# Patient Record
Sex: Male | Born: 1935 | Race: White | Hispanic: No | Marital: Married
Health system: Southern US, Community
[De-identification: ages and names within clinical notes are randomized; demographics above are authoritative.]

---

## 1998-11-02 ENCOUNTER — Ambulatory Visit (HOSPITAL_COMMUNITY): Admission: RE | Admit: 1998-11-02 | Discharge: 1998-11-02 | Payer: Self-pay | Admitting: Cardiology

## 1998-11-02 ENCOUNTER — Encounter: Payer: Self-pay | Admitting: Cardiology

## 1999-02-11 ENCOUNTER — Encounter: Payer: Self-pay | Admitting: Emergency Medicine

## 1999-02-11 ENCOUNTER — Inpatient Hospital Stay (HOSPITAL_COMMUNITY): Admission: EM | Admit: 1999-02-11 | Discharge: 1999-02-12 | Payer: Self-pay | Admitting: Emergency Medicine

## 1999-02-12 ENCOUNTER — Encounter: Payer: Self-pay | Admitting: Cardiology

## 1999-09-20 ENCOUNTER — Encounter (HOSPITAL_COMMUNITY): Admission: RE | Admit: 1999-09-20 | Discharge: 1999-12-19 | Payer: Self-pay | Admitting: Internal Medicine

## 1999-10-22 ENCOUNTER — Encounter: Admission: RE | Admit: 1999-10-22 | Discharge: 1999-10-22 | Payer: Self-pay | Admitting: Cardiology

## 1999-10-22 ENCOUNTER — Encounter: Payer: Self-pay | Admitting: Cardiology

## 1999-10-26 ENCOUNTER — Inpatient Hospital Stay (HOSPITAL_COMMUNITY): Admission: EM | Admit: 1999-10-26 | Discharge: 1999-11-04 | Payer: Self-pay | Admitting: Emergency Medicine

## 1999-10-28 ENCOUNTER — Encounter: Payer: Self-pay | Admitting: Thoracic Surgery (Cardiothoracic Vascular Surgery)

## 1999-10-29 ENCOUNTER — Encounter: Payer: Self-pay | Admitting: Thoracic Surgery (Cardiothoracic Vascular Surgery)

## 1999-10-30 ENCOUNTER — Encounter: Payer: Self-pay | Admitting: Thoracic Surgery (Cardiothoracic Vascular Surgery)

## 1999-10-31 ENCOUNTER — Encounter: Payer: Self-pay | Admitting: Thoracic Surgery (Cardiothoracic Vascular Surgery)

## 1999-11-01 ENCOUNTER — Encounter: Payer: Self-pay | Admitting: Thoracic Surgery (Cardiothoracic Vascular Surgery)

## 1999-11-18 ENCOUNTER — Encounter
Admission: RE | Admit: 1999-11-18 | Discharge: 1999-11-18 | Payer: Self-pay | Admitting: Thoracic Surgery (Cardiothoracic Vascular Surgery)

## 1999-11-18 ENCOUNTER — Encounter: Payer: Self-pay | Admitting: Thoracic Surgery (Cardiothoracic Vascular Surgery)

## 1999-12-03 ENCOUNTER — Encounter (HOSPITAL_COMMUNITY): Admission: RE | Admit: 1999-12-03 | Discharge: 2000-03-02 | Payer: Self-pay | Admitting: Cardiology

## 2000-01-13 ENCOUNTER — Encounter: Payer: Self-pay | Admitting: *Deleted

## 2000-01-13 ENCOUNTER — Inpatient Hospital Stay (HOSPITAL_COMMUNITY): Admission: EM | Admit: 2000-01-13 | Discharge: 2000-01-14 | Payer: Self-pay | Admitting: *Deleted

## 2000-02-19 ENCOUNTER — Encounter (HOSPITAL_COMMUNITY): Admission: RE | Admit: 2000-02-19 | Discharge: 2000-05-19 | Payer: Self-pay | Admitting: Internal Medicine

## 2000-03-03 ENCOUNTER — Encounter (HOSPITAL_COMMUNITY): Admission: RE | Admit: 2000-03-03 | Discharge: 2000-06-01 | Payer: Self-pay | Admitting: Cardiology

## 2000-04-02 ENCOUNTER — Ambulatory Visit (HOSPITAL_COMMUNITY): Admission: RE | Admit: 2000-04-02 | Discharge: 2000-04-02 | Payer: Self-pay | Admitting: Internal Medicine

## 2000-11-12 ENCOUNTER — Encounter: Payer: Self-pay | Admitting: Emergency Medicine

## 2000-11-12 ENCOUNTER — Emergency Department (HOSPITAL_COMMUNITY): Admission: EM | Admit: 2000-11-12 | Discharge: 2000-11-12 | Payer: Self-pay | Admitting: Emergency Medicine

## 2000-11-16 ENCOUNTER — Emergency Department (HOSPITAL_COMMUNITY): Admission: EM | Admit: 2000-11-16 | Discharge: 2000-11-16 | Payer: Self-pay

## 2000-11-20 ENCOUNTER — Encounter: Payer: Self-pay | Admitting: Orthopedic Surgery

## 2000-11-20 ENCOUNTER — Encounter: Admission: RE | Admit: 2000-11-20 | Discharge: 2000-11-20 | Payer: Self-pay | Admitting: Cardiology

## 2000-11-20 ENCOUNTER — Encounter: Admission: RE | Admit: 2000-11-20 | Discharge: 2000-11-20 | Payer: Self-pay | Admitting: Orthopedic Surgery

## 2000-11-20 ENCOUNTER — Encounter: Payer: Self-pay | Admitting: Cardiology

## 2000-11-28 ENCOUNTER — Encounter: Payer: Self-pay | Admitting: Orthopedic Surgery

## 2000-11-28 ENCOUNTER — Encounter: Admission: RE | Admit: 2000-11-28 | Discharge: 2000-11-28 | Payer: Self-pay | Admitting: Orthopedic Surgery

## 2000-12-15 ENCOUNTER — Encounter: Payer: Self-pay | Admitting: Occupational Medicine

## 2000-12-15 ENCOUNTER — Encounter: Admission: RE | Admit: 2000-12-15 | Discharge: 2000-12-15 | Payer: Self-pay | Admitting: Occupational Medicine

## 2001-03-29 ENCOUNTER — Encounter: Payer: Self-pay | Admitting: Cardiology

## 2001-03-29 ENCOUNTER — Emergency Department (HOSPITAL_COMMUNITY): Admission: EM | Admit: 2001-03-29 | Discharge: 2001-03-29 | Payer: Self-pay | Admitting: Emergency Medicine

## 2001-03-30 ENCOUNTER — Encounter (INDEPENDENT_AMBULATORY_CARE_PROVIDER_SITE_OTHER): Payer: Self-pay

## 2001-03-30 ENCOUNTER — Ambulatory Visit (HOSPITAL_COMMUNITY): Admission: RE | Admit: 2001-03-30 | Discharge: 2001-03-30 | Payer: Self-pay | Admitting: *Deleted

## 2001-04-20 ENCOUNTER — Encounter: Admission: RE | Admit: 2001-04-20 | Discharge: 2001-04-20 | Payer: Self-pay | Admitting: Internal Medicine

## 2001-04-20 ENCOUNTER — Encounter: Payer: Self-pay | Admitting: Internal Medicine

## 2001-04-21 ENCOUNTER — Inpatient Hospital Stay (HOSPITAL_COMMUNITY): Admission: AD | Admit: 2001-04-21 | Discharge: 2001-04-23 | Payer: Self-pay | Admitting: Internal Medicine

## 2001-04-21 ENCOUNTER — Encounter (INDEPENDENT_AMBULATORY_CARE_PROVIDER_SITE_OTHER): Payer: Self-pay | Admitting: Specialist

## 2001-07-13 ENCOUNTER — Encounter: Payer: Self-pay | Admitting: Emergency Medicine

## 2001-07-14 ENCOUNTER — Encounter: Payer: Self-pay | Admitting: Emergency Medicine

## 2001-07-14 ENCOUNTER — Encounter: Payer: Self-pay | Admitting: Cardiology

## 2001-07-14 ENCOUNTER — Inpatient Hospital Stay (HOSPITAL_COMMUNITY): Admission: EM | Admit: 2001-07-14 | Discharge: 2001-07-18 | Payer: Self-pay

## 2001-07-15 ENCOUNTER — Encounter: Payer: Self-pay | Admitting: Cardiology

## 2001-08-25 ENCOUNTER — Ambulatory Visit (HOSPITAL_COMMUNITY): Admission: RE | Admit: 2001-08-25 | Discharge: 2001-08-25 | Payer: Self-pay | Admitting: Specialist

## 2001-10-10 ENCOUNTER — Encounter: Payer: Self-pay | Admitting: Emergency Medicine

## 2001-10-10 ENCOUNTER — Inpatient Hospital Stay (HOSPITAL_COMMUNITY): Admission: EM | Admit: 2001-10-10 | Discharge: 2001-10-12 | Payer: Self-pay | Admitting: Emergency Medicine

## 2001-10-11 ENCOUNTER — Encounter: Payer: Self-pay | Admitting: Cardiology

## 2002-07-22 ENCOUNTER — Encounter (HOSPITAL_COMMUNITY): Admission: RE | Admit: 2002-07-22 | Discharge: 2002-07-22 | Payer: Self-pay | Admitting: Internal Medicine

## 2002-11-17 ENCOUNTER — Encounter: Admission: RE | Admit: 2002-11-17 | Discharge: 2002-11-17 | Payer: Self-pay | Admitting: Internal Medicine

## 2002-11-17 ENCOUNTER — Encounter: Payer: Self-pay | Admitting: Internal Medicine

## 2003-09-23 ENCOUNTER — Emergency Department (HOSPITAL_COMMUNITY): Admission: EM | Admit: 2003-09-23 | Discharge: 2003-09-24 | Payer: Self-pay | Admitting: Emergency Medicine

## 2003-12-08 ENCOUNTER — Encounter: Admission: RE | Admit: 2003-12-08 | Discharge: 2003-12-08 | Payer: Self-pay | Admitting: Rheumatology

## 2003-12-16 ENCOUNTER — Encounter: Admission: RE | Admit: 2003-12-16 | Discharge: 2003-12-16 | Payer: Self-pay | Admitting: Rheumatology

## 2003-12-28 ENCOUNTER — Ambulatory Visit (HOSPITAL_COMMUNITY): Admission: RE | Admit: 2003-12-28 | Discharge: 2003-12-28 | Payer: Self-pay | Admitting: Orthopedic Surgery

## 2004-01-10 ENCOUNTER — Inpatient Hospital Stay (HOSPITAL_COMMUNITY): Admission: AD | Admit: 2004-01-10 | Discharge: 2004-01-12 | Payer: Self-pay | Admitting: Cardiovascular Disease

## 2004-02-12 ENCOUNTER — Emergency Department (HOSPITAL_COMMUNITY): Admission: EM | Admit: 2004-02-12 | Discharge: 2004-02-12 | Payer: Self-pay

## 2004-05-29 ENCOUNTER — Ambulatory Visit (HOSPITAL_COMMUNITY): Admission: RE | Admit: 2004-05-29 | Discharge: 2004-05-29 | Payer: Self-pay | Admitting: Gastroenterology

## 2004-06-19 ENCOUNTER — Encounter (HOSPITAL_COMMUNITY): Admission: RE | Admit: 2004-06-19 | Discharge: 2004-08-30 | Payer: Self-pay | Admitting: Internal Medicine

## 2004-06-22 ENCOUNTER — Emergency Department (HOSPITAL_COMMUNITY): Admission: EM | Admit: 2004-06-22 | Discharge: 2004-06-22 | Payer: Self-pay | Admitting: *Deleted

## 2004-06-24 ENCOUNTER — Inpatient Hospital Stay (HOSPITAL_COMMUNITY): Admission: AD | Admit: 2004-06-24 | Discharge: 2004-06-30 | Payer: Self-pay | Admitting: Internal Medicine

## 2004-07-05 ENCOUNTER — Ambulatory Visit (HOSPITAL_COMMUNITY): Admission: RE | Admit: 2004-07-05 | Discharge: 2004-07-05 | Payer: Self-pay | Admitting: Internal Medicine

## 2005-08-01 IMAGING — CR DG CHEST 1V PORT
1 series · 1 of 1 positions shown · non-contrast
Comparison: none

CLINICAL DATA: Chest pain.
 PORTABLE CHEST, 01/10/04, [DATE] HOURS
 There are changes of COPD.  There heart is normal in size.  There has been a previous median sternotomy.  There are no infiltrative or edematous changes.
 IMPRESSION
 Changes of COPD.  No active disease.

[view not recorded]
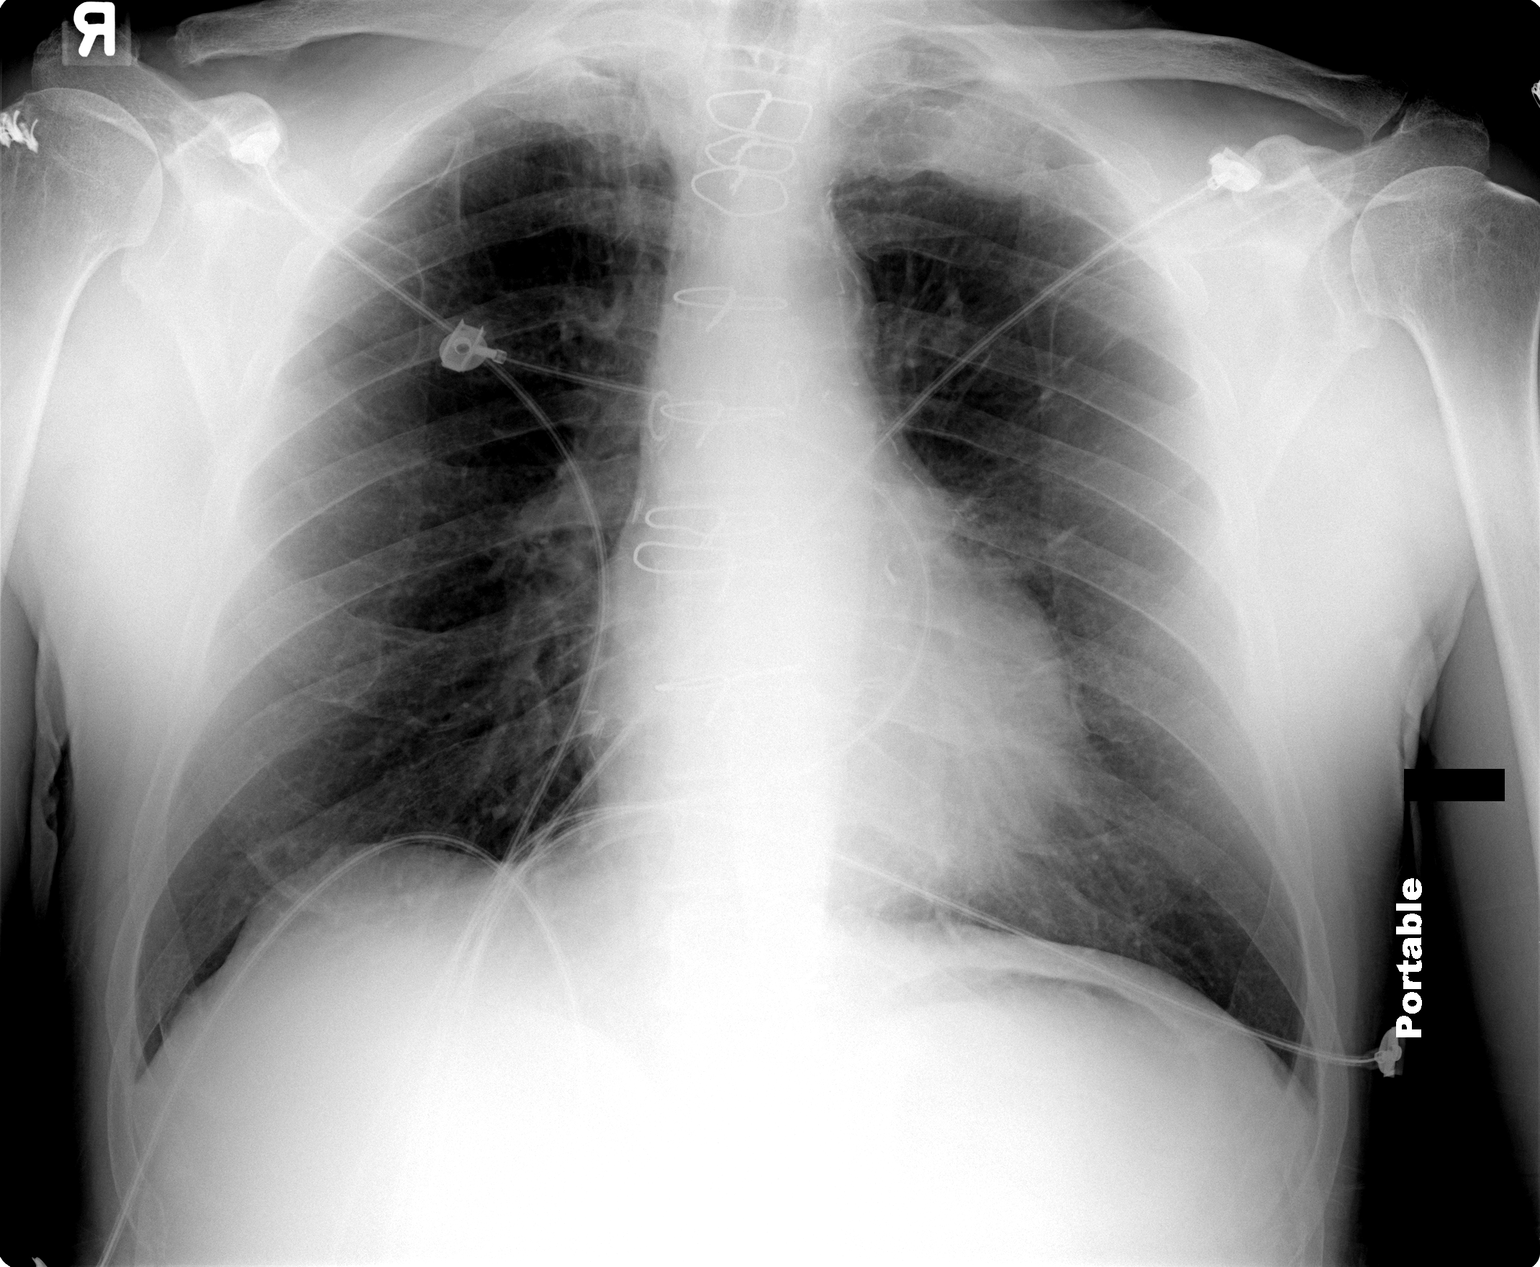

[1 of 1 positions shown; findings below may reference images not displayed]

## 2006-11-09 ENCOUNTER — Inpatient Hospital Stay (HOSPITAL_COMMUNITY): Admission: EM | Admit: 2006-11-09 | Discharge: 2006-11-12 | Payer: Self-pay | Admitting: Emergency Medicine

## 2006-11-29 ENCOUNTER — Inpatient Hospital Stay (HOSPITAL_COMMUNITY): Admission: EM | Admit: 2006-11-29 | Discharge: 2006-11-30 | Payer: Self-pay | Admitting: Emergency Medicine

## 2006-12-10 ENCOUNTER — Encounter (HOSPITAL_COMMUNITY): Admission: RE | Admit: 2006-12-10 | Discharge: 2007-03-10 | Payer: Self-pay | Admitting: Cardiology

## 2006-12-23 ENCOUNTER — Inpatient Hospital Stay (HOSPITAL_COMMUNITY): Admission: EM | Admit: 2006-12-23 | Discharge: 2006-12-26 | Payer: Self-pay | Admitting: Emergency Medicine

## 2007-01-05 ENCOUNTER — Inpatient Hospital Stay (HOSPITAL_COMMUNITY): Admission: AD | Admit: 2007-01-05 | Discharge: 2007-01-10 | Payer: Self-pay | Admitting: Cardiology

## 2007-01-05 ENCOUNTER — Ambulatory Visit: Payer: Self-pay | Admitting: Internal Medicine

## 2007-01-07 ENCOUNTER — Encounter (INDEPENDENT_AMBULATORY_CARE_PROVIDER_SITE_OTHER): Payer: Self-pay | Admitting: Cardiology

## 2007-01-21 ENCOUNTER — Ambulatory Visit: Payer: Self-pay

## 2007-02-04 ENCOUNTER — Ambulatory Visit: Payer: Self-pay | Admitting: Cardiology

## 2007-02-10 ENCOUNTER — Ambulatory Visit: Payer: Self-pay | Admitting: Cardiology

## 2007-02-10 LAB — CONVERTED CEMR LAB
Basophils Absolute: 0 10*3/uL (ref 0.0–0.1)
CO2: 32 meq/L (ref 19–32)
Calcium: 9.1 mg/dL (ref 8.4–10.5)
Creatinine, Ser: 1.2 mg/dL (ref 0.4–1.5)
Eosinophils Absolute: 0.3 10*3/uL (ref 0.0–0.6)
GFR calc Af Amer: 77 mL/min
Hemoglobin: 12.4 g/dL — ABNORMAL LOW (ref 13.0–17.0)
INR: 0.9 (ref 0.9–2.0)
Monocytes Relative: 10.9 % (ref 3.0–11.0)
Potassium: 4.1 meq/L (ref 3.5–5.1)
Prothrombin Time: 11.7 s (ref 10.0–14.0)
WBC: 4.8 10*3/uL (ref 4.5–10.5)

## 2007-02-17 ENCOUNTER — Ambulatory Visit: Payer: Self-pay | Admitting: Internal Medicine

## 2007-02-17 ENCOUNTER — Inpatient Hospital Stay (HOSPITAL_COMMUNITY): Admission: RE | Admit: 2007-02-17 | Discharge: 2007-02-17 | Payer: Self-pay | Admitting: Internal Medicine

## 2007-04-29 ENCOUNTER — Ambulatory Visit: Payer: Self-pay | Admitting: Internal Medicine

## 2007-07-08 ENCOUNTER — Ambulatory Visit: Payer: Self-pay | Admitting: Internal Medicine

## 2007-07-08 ENCOUNTER — Ambulatory Visit: Payer: Self-pay

## 2007-08-04 ENCOUNTER — Ambulatory Visit: Payer: Self-pay | Admitting: Internal Medicine

## 2007-08-10 ENCOUNTER — Encounter (INDEPENDENT_AMBULATORY_CARE_PROVIDER_SITE_OTHER): Payer: Self-pay | Admitting: Internal Medicine

## 2007-08-23 ENCOUNTER — Ambulatory Visit (HOSPITAL_COMMUNITY): Admission: RE | Admit: 2007-08-23 | Discharge: 2007-08-23 | Payer: Self-pay | Admitting: Gastroenterology

## 2007-08-23 ENCOUNTER — Inpatient Hospital Stay (HOSPITAL_COMMUNITY): Admission: EM | Admit: 2007-08-23 | Discharge: 2007-08-24 | Payer: Self-pay | Admitting: Emergency Medicine

## 2007-08-31 ENCOUNTER — Ambulatory Visit: Payer: Self-pay | Admitting: Internal Medicine

## 2007-09-06 ENCOUNTER — Ambulatory Visit: Payer: Self-pay | Admitting: Internal Medicine

## 2007-09-17 DIAGNOSIS — R0602 Shortness of breath: Secondary | ICD-10-CM

## 2007-09-17 DIAGNOSIS — G2581 Restless legs syndrome: Secondary | ICD-10-CM

## 2007-09-17 DIAGNOSIS — I10 Essential (primary) hypertension: Secondary | ICD-10-CM | POA: Insufficient documentation

## 2007-09-17 DIAGNOSIS — R079 Chest pain, unspecified: Secondary | ICD-10-CM

## 2007-09-17 DIAGNOSIS — Z8679 Personal history of other diseases of the circulatory system: Secondary | ICD-10-CM

## 2007-09-17 DIAGNOSIS — J449 Chronic obstructive pulmonary disease, unspecified: Secondary | ICD-10-CM

## 2007-09-17 DIAGNOSIS — I2510 Atherosclerotic heart disease of native coronary artery without angina pectoris: Secondary | ICD-10-CM | POA: Insufficient documentation

## 2007-09-17 DIAGNOSIS — J4489 Other specified chronic obstructive pulmonary disease: Secondary | ICD-10-CM | POA: Insufficient documentation

## 2007-10-20 ENCOUNTER — Ambulatory Visit: Payer: Self-pay | Admitting: Internal Medicine

## 2007-10-20 LAB — CONVERTED CEMR LAB
Free T4: 0.8 ng/dL (ref 0.6–1.6)
T3, Free: 2.9 pg/mL (ref 2.3–4.2)

## 2008-01-10 ENCOUNTER — Ambulatory Visit: Payer: Self-pay | Admitting: Internal Medicine

## 2008-01-10 ENCOUNTER — Telehealth (INDEPENDENT_AMBULATORY_CARE_PROVIDER_SITE_OTHER): Payer: Self-pay | Admitting: *Deleted

## 2008-01-19 ENCOUNTER — Ambulatory Visit: Payer: Self-pay

## 2008-01-31 ENCOUNTER — Encounter: Payer: Self-pay | Admitting: Internal Medicine

## 2008-01-31 ENCOUNTER — Ambulatory Visit (HOSPITAL_COMMUNITY): Admission: RE | Admit: 2008-01-31 | Discharge: 2008-01-31 | Payer: Self-pay | Admitting: Cardiology

## 2008-03-09 ENCOUNTER — Ambulatory Visit: Payer: Self-pay | Admitting: Internal Medicine

## 2008-04-28 ENCOUNTER — Ambulatory Visit: Payer: Self-pay | Admitting: Internal Medicine

## 2008-05-02 ENCOUNTER — Ambulatory Visit: Payer: Self-pay | Admitting: Cardiology

## 2008-05-03 ENCOUNTER — Telehealth: Payer: Self-pay | Admitting: Internal Medicine

## 2008-05-22 ENCOUNTER — Ambulatory Visit: Payer: Self-pay | Admitting: Internal Medicine

## 2008-05-22 ENCOUNTER — Encounter: Payer: Self-pay | Admitting: Internal Medicine

## 2008-05-22 DIAGNOSIS — R942 Abnormal results of pulmonary function studies: Secondary | ICD-10-CM

## 2008-05-22 DIAGNOSIS — D638 Anemia in other chronic diseases classified elsewhere: Secondary | ICD-10-CM

## 2008-06-14 ENCOUNTER — Ambulatory Visit: Payer: Self-pay | Admitting: Oncology

## 2008-06-23 LAB — CBC & DIFF AND RETIC
BASO%: 0.5 % (ref 0.0–2.0)
EOS%: 6.5 % (ref 0.0–7.0)
HCT: 29.7 % — ABNORMAL LOW (ref 38.7–49.9)
LYMPH%: 26.4 % (ref 14.0–48.0)
MCH: 24 pg — ABNORMAL LOW (ref 28.0–33.4)
MCHC: 32.7 g/dL (ref 32.0–35.9)
MCV: 73.5 fL — ABNORMAL LOW (ref 81.6–98.0)
NEUT%: 56.1 % (ref 40.0–75.0)
Platelets: 251 10*3/uL (ref 145–400)

## 2008-06-23 LAB — CHCC SMEAR

## 2008-06-26 LAB — IRON AND TIBC
%SAT: 6 % — ABNORMAL LOW (ref 20–55)
Iron: 23 ug/dL — ABNORMAL LOW (ref 42–165)
TIBC: 384 ug/dL (ref 215–435)

## 2008-06-26 LAB — COMPREHENSIVE METABOLIC PANEL
CO2: 27 mEq/L (ref 19–32)
Calcium: 9.4 mg/dL (ref 8.4–10.5)
Chloride: 104 mEq/L (ref 96–112)
Creatinine, Ser: 1.48 mg/dL (ref 0.40–1.50)
Glucose, Bld: 88 mg/dL (ref 70–99)
Total Bilirubin: 0.3 mg/dL (ref 0.3–1.2)
Total Protein: 6.7 g/dL (ref 6.0–8.3)

## 2008-06-26 LAB — LACTATE DEHYDROGENASE: LDH: 168 U/L (ref 94–250)

## 2008-06-30 LAB — FECAL OCCULT BLOOD, GUAIAC: Occult Blood: NEGATIVE

## 2008-07-05 ENCOUNTER — Encounter: Admission: RE | Admit: 2008-07-05 | Discharge: 2008-07-05 | Payer: Self-pay | Admitting: Gastroenterology

## 2008-07-13 LAB — CBC WITH DIFFERENTIAL/PLATELET
Eosinophils Absolute: 0.3 10*3/uL (ref 0.0–0.5)
LYMPH%: 21.7 % (ref 14.0–48.0)
MCV: 77.9 fL — ABNORMAL LOW (ref 81.6–98.0)
MONO%: 10.2 % (ref 0.0–13.0)
NEUT#: 3.6 10*3/uL (ref 1.5–6.5)
Platelets: 182 10*3/uL (ref 145–400)
RBC: 4.16 10*6/uL — ABNORMAL LOW (ref 4.20–5.71)
WBC: 5.7 10*3/uL (ref 4.0–10.0)

## 2008-07-28 LAB — CBC WITH DIFFERENTIAL/PLATELET
BASO%: 1.5 % (ref 0.0–2.0)
EOS%: 5.2 % (ref 0.0–7.0)
LYMPH%: 26.6 % (ref 14.0–48.0)
MCHC: 33.6 g/dL (ref 32.0–35.9)
MCV: 80.4 fL — ABNORMAL LOW (ref 81.6–98.0)
MONO#: 0.6 10*3/uL (ref 0.1–0.9)
MONO%: 11.3 % (ref 0.0–13.0)
Platelets: 178 10*3/uL (ref 145–400)
RBC: 4.07 10*6/uL — ABNORMAL LOW (ref 4.20–5.71)
WBC: 5.1 10*3/uL (ref 4.0–10.0)

## 2008-07-31 LAB — FERRITIN: Ferritin: 665 ng/mL — ABNORMAL HIGH (ref 22–322)

## 2008-07-31 LAB — COMPREHENSIVE METABOLIC PANEL
ALT: 11 U/L (ref 0–53)
Alkaline Phosphatase: 52 U/L (ref 39–117)
Sodium: 141 mEq/L (ref 135–145)
Total Bilirubin: 0.3 mg/dL (ref 0.3–1.2)
Total Protein: 6.5 g/dL (ref 6.0–8.3)

## 2008-07-31 LAB — IRON AND TIBC: %SAT: 22 % (ref 20–55)

## 2008-08-22 ENCOUNTER — Ambulatory Visit: Payer: Self-pay | Admitting: Vascular Surgery

## 2008-09-26 ENCOUNTER — Ambulatory Visit: Payer: Self-pay | Admitting: Oncology

## 2008-09-28 LAB — CBC WITH DIFFERENTIAL/PLATELET
BASO%: 0.9 % (ref 0.0–2.0)
EOS%: 6.2 % (ref 0.0–7.0)
HGB: 12.6 g/dL — ABNORMAL LOW (ref 13.0–17.1)
MCH: 29.4 pg (ref 28.0–33.4)
MCHC: 34.6 g/dL (ref 32.0–35.9)
MONO%: 9.8 % (ref 0.0–13.0)
RBC: 4.28 10*6/uL (ref 4.20–5.71)
RDW: 21.6 % — ABNORMAL HIGH (ref 11.2–14.6)
lymph#: 1.5 10*3/uL (ref 0.9–3.3)

## 2008-09-30 LAB — FERRITIN: Ferritin: 280 ng/mL (ref 22–322)

## 2008-09-30 LAB — COMPREHENSIVE METABOLIC PANEL
ALT: 15 U/L (ref 0–53)
AST: 21 U/L (ref 0–37)
Albumin: 3.9 g/dL (ref 3.5–5.2)
Alkaline Phosphatase: 58 U/L (ref 39–117)
Calcium: 9 mg/dL (ref 8.4–10.5)
Chloride: 105 mEq/L (ref 96–112)
Potassium: 4 mEq/L (ref 3.5–5.3)
Sodium: 138 mEq/L (ref 135–145)
Total Protein: 6.6 g/dL (ref 6.0–8.3)

## 2008-09-30 LAB — TRANSFERRIN RECEPTOR, SOLUABLE: Transferrin Receptor, Soluble: 16 nmol/L

## 2008-10-10 ENCOUNTER — Ambulatory Visit: Payer: Self-pay | Admitting: Internal Medicine

## 2008-11-28 ENCOUNTER — Ambulatory Visit: Payer: Self-pay | Admitting: Oncology

## 2008-12-06 ENCOUNTER — Ambulatory Visit (HOSPITAL_COMMUNITY): Admission: RE | Admit: 2008-12-06 | Discharge: 2008-12-06 | Payer: Self-pay | Admitting: Cardiology

## 2008-12-14 ENCOUNTER — Inpatient Hospital Stay (HOSPITAL_COMMUNITY): Admission: RE | Admit: 2008-12-14 | Discharge: 2008-12-15 | Payer: Self-pay | Admitting: Cardiology

## 2008-12-28 ENCOUNTER — Encounter (HOSPITAL_COMMUNITY): Admission: RE | Admit: 2008-12-28 | Discharge: 2009-03-28 | Payer: Self-pay | Admitting: Cardiology

## 2009-01-01 ENCOUNTER — Encounter: Payer: Self-pay | Admitting: Internal Medicine

## 2009-01-02 ENCOUNTER — Ambulatory Visit: Payer: Self-pay | Admitting: Internal Medicine

## 2009-01-30 ENCOUNTER — Ambulatory Visit: Payer: Self-pay | Admitting: Vascular Surgery

## 2009-02-05 ENCOUNTER — Ambulatory Visit: Payer: Self-pay | Admitting: Vascular Surgery

## 2009-02-05 ENCOUNTER — Ambulatory Visit (HOSPITAL_COMMUNITY): Admission: RE | Admit: 2009-02-05 | Discharge: 2009-02-05 | Payer: Self-pay | Admitting: Vascular Surgery

## 2009-02-07 ENCOUNTER — Encounter: Admission: RE | Admit: 2009-02-07 | Discharge: 2009-02-07 | Payer: Self-pay | Admitting: Vascular Surgery

## 2009-02-09 ENCOUNTER — Emergency Department (HOSPITAL_COMMUNITY): Admission: EM | Admit: 2009-02-09 | Discharge: 2009-02-09 | Payer: Self-pay | Admitting: Emergency Medicine

## 2009-02-20 ENCOUNTER — Ambulatory Visit: Payer: Self-pay | Admitting: Vascular Surgery

## 2009-03-12 ENCOUNTER — Telehealth: Payer: Self-pay | Admitting: Internal Medicine

## 2009-03-14 ENCOUNTER — Inpatient Hospital Stay (HOSPITAL_COMMUNITY): Admission: RE | Admit: 2009-03-14 | Discharge: 2009-03-17 | Payer: Self-pay | Admitting: Vascular Surgery

## 2009-03-14 ENCOUNTER — Ambulatory Visit: Payer: Self-pay | Admitting: Vascular Surgery

## 2009-03-28 ENCOUNTER — Ambulatory Visit: Payer: Self-pay | Admitting: Vascular Surgery

## 2009-04-03 ENCOUNTER — Ambulatory Visit: Payer: Self-pay | Admitting: Vascular Surgery

## 2009-04-06 ENCOUNTER — Encounter: Payer: Self-pay | Admitting: Internal Medicine

## 2009-04-11 ENCOUNTER — Ambulatory Visit: Payer: Self-pay | Admitting: Internal Medicine

## 2009-04-20 ENCOUNTER — Encounter: Payer: Self-pay | Admitting: Internal Medicine

## 2009-04-24 ENCOUNTER — Ambulatory Visit: Payer: Self-pay | Admitting: Vascular Surgery

## 2009-05-25 ENCOUNTER — Ambulatory Visit: Payer: Self-pay | Admitting: Vascular Surgery

## 2009-07-02 ENCOUNTER — Ambulatory Visit: Payer: Self-pay | Admitting: Internal Medicine

## 2009-07-10 ENCOUNTER — Encounter: Payer: Self-pay | Admitting: Internal Medicine

## 2009-10-09 ENCOUNTER — Ambulatory Visit: Payer: Self-pay | Admitting: Internal Medicine

## 2009-11-22 IMAGING — CT CT CHEST W/O CM
2 of 5 series · 12 of 36 positions shown, 15 images · non-contrast
Comparison: Chest radiography 08/23/2007 and prior chest CT
11/10/2007

CLINICAL DATA: Chest congestion.  Chest pain.  Previous coronary
artery disease.  Assess for amiodarone toxicity.

CT CHEST WITHOUT CONTRAST
TECHNIQUE: Multidetector CT imaging of the chest was performed
following the standard protocol without IV contrast.

[Series 2: chest_hi_res 5.0 b40f st · axial · 0.73mm/px · z∈[-283,-43]mm · 9 of 60 slices shown, 12 images]
[im 6/60  mediastinal]
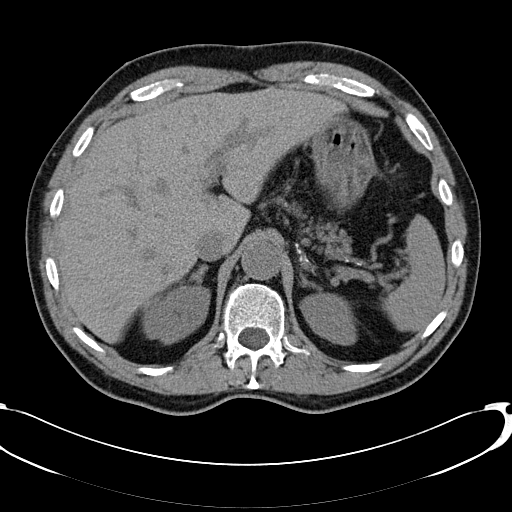
[im 6/60  lung]
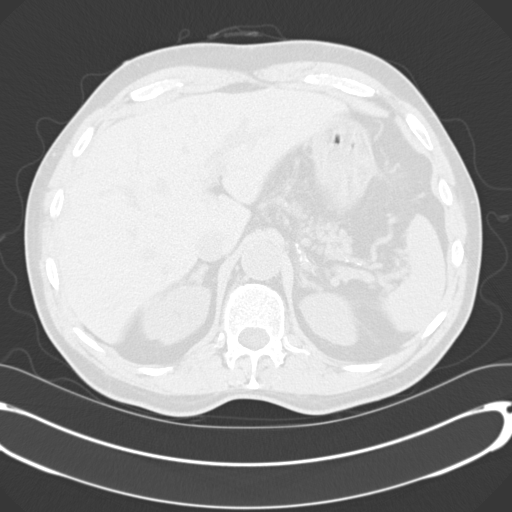
[im 12/60  lung]
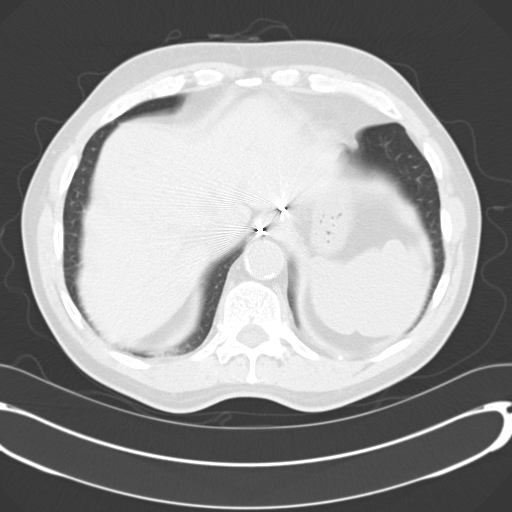
[im 18/60  lung]
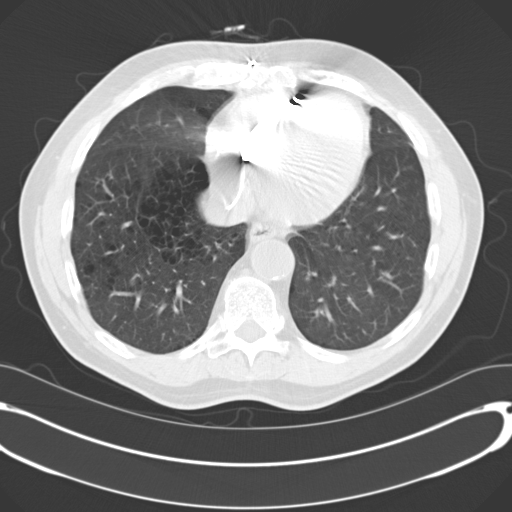
[im 24/60  lung]
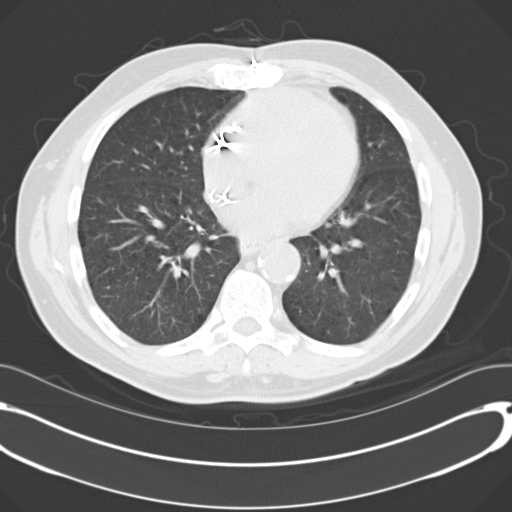
[im 30/60  mediastinal]
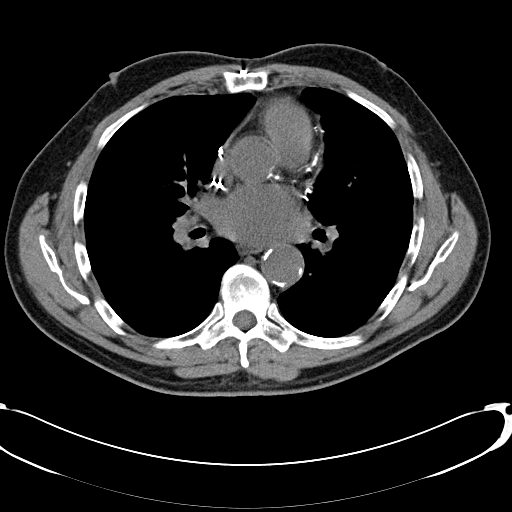
[im 30/60  lung]
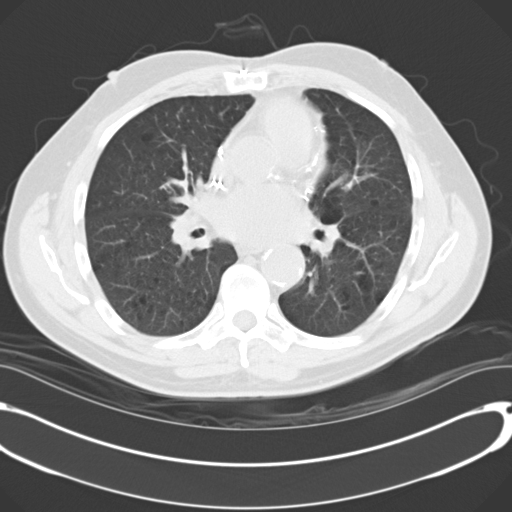
[im 36/60  lung]
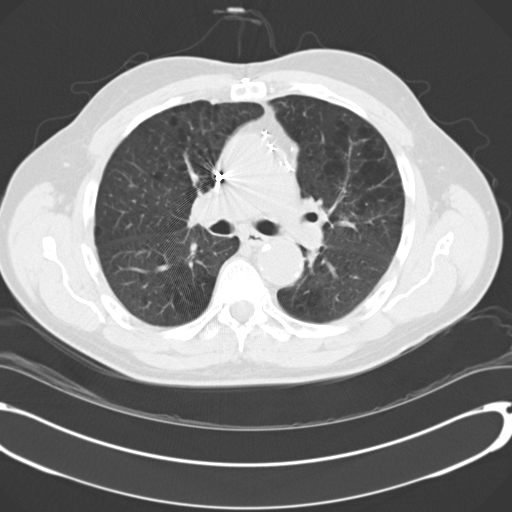
[im 42/60  lung]
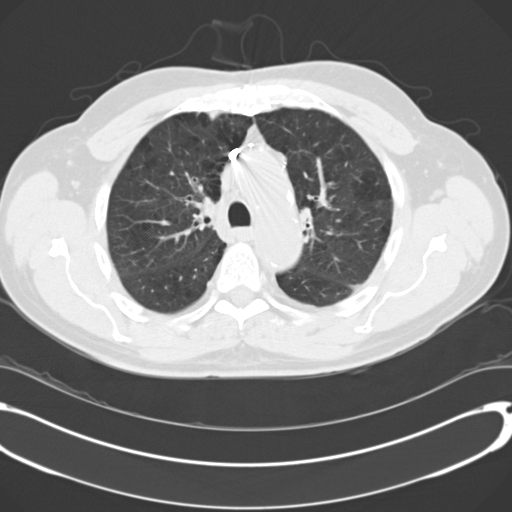
[im 48/60  lung]
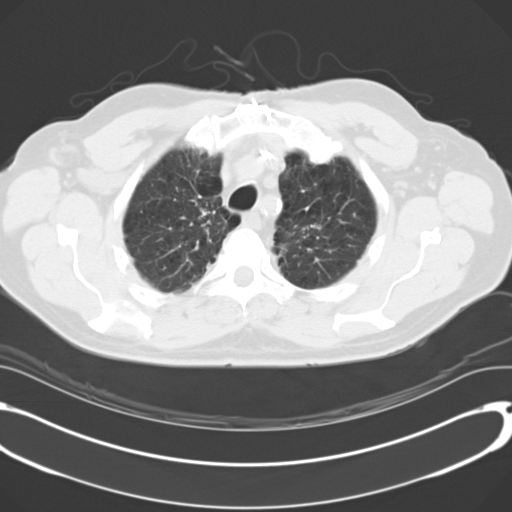
[im 54/60  mediastinal]
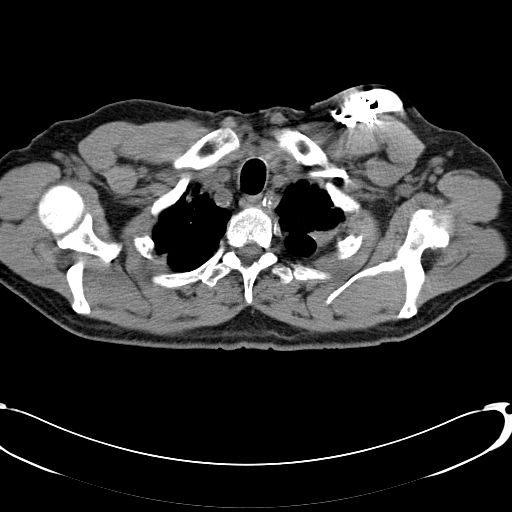
[im 54/60  lung]
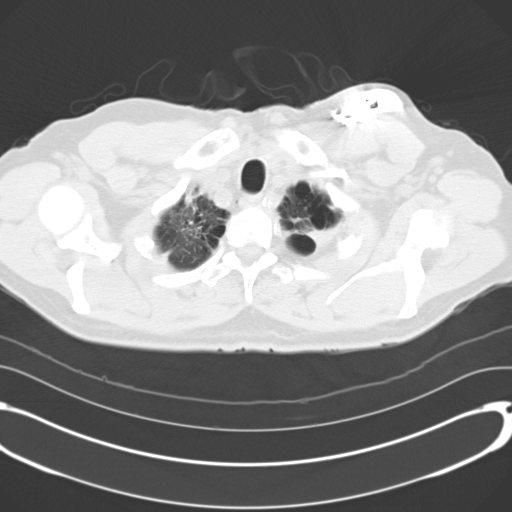

[Series 602: <mpr thick range> · coronal · 0.73mm/px · 3 of 86 slices shown]
[im 18/86  lung]
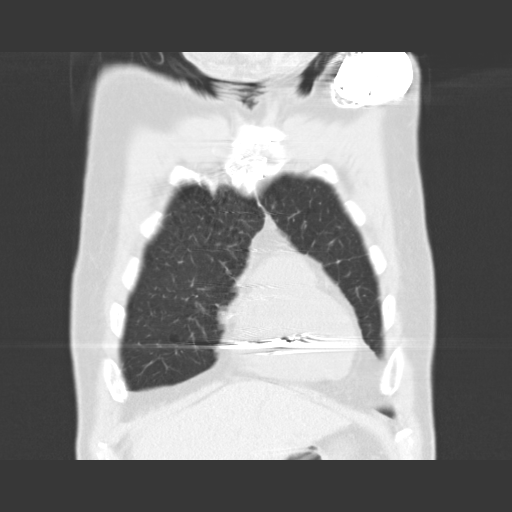
[im 35/86  lung]
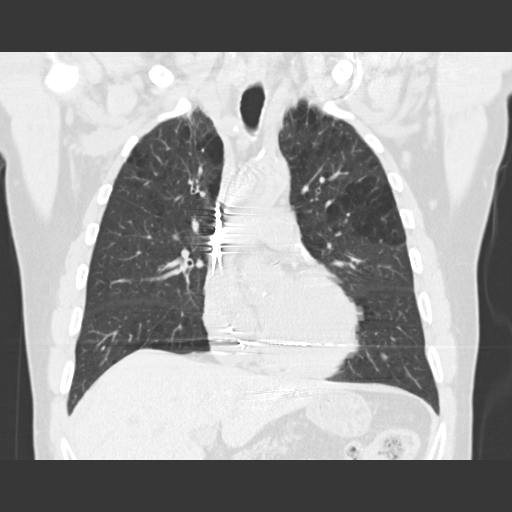
[im 52/86  lung]
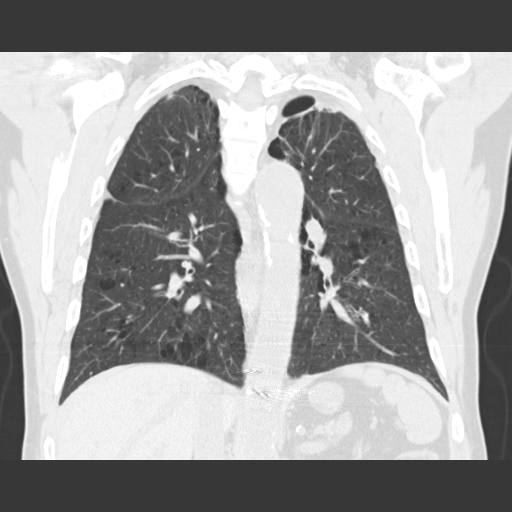

[12 of 36 positions shown; findings below may reference images not displayed]

FINDINGS: There is chronic centrilobular emphysema with chronic
pulmonary scarring particularly notable at the apices.  The pattern
is unchanged when compared to the examination October 2006.
There is no imaging evidence of progressive fibrosis to suggest
amiodarone toxicity.  There is no sign of a developing mass.  There
is atherosclerosis of the aorta.  No adenopathy.  Scans in the
upper abdomen are unremarkable.
IMPRESSION: Stable pattern of centrilobular emphysema with pulmonary scarring
most pronounced at the apices.  No sign of progressive fibrosis to
suggest amiodarone toxicity.

## 2009-12-24 ENCOUNTER — Ambulatory Visit: Payer: Self-pay | Admitting: Internal Medicine

## 2009-12-24 DIAGNOSIS — R0609 Other forms of dyspnea: Secondary | ICD-10-CM

## 2009-12-24 DIAGNOSIS — R0989 Other specified symptoms and signs involving the circulatory and respiratory systems: Secondary | ICD-10-CM

## 2009-12-31 ENCOUNTER — Ambulatory Visit: Payer: Self-pay | Admitting: Pulmonary Disease

## 2010-01-07 ENCOUNTER — Encounter: Payer: Self-pay | Admitting: Internal Medicine

## 2010-01-08 ENCOUNTER — Ambulatory Visit: Payer: Self-pay | Admitting: Internal Medicine

## 2010-01-22 ENCOUNTER — Encounter (HOSPITAL_COMMUNITY): Admission: RE | Admit: 2010-01-22 | Discharge: 2010-04-22 | Payer: Self-pay | Admitting: Internal Medicine

## 2010-01-25 ENCOUNTER — Encounter: Payer: Self-pay | Admitting: Internal Medicine

## 2010-02-22 ENCOUNTER — Ambulatory Visit: Payer: Self-pay | Admitting: Internal Medicine

## 2010-02-28 ENCOUNTER — Ambulatory Visit: Payer: Self-pay | Admitting: Vascular Surgery

## 2010-03-06 ENCOUNTER — Ambulatory Visit (HOSPITAL_COMMUNITY): Admission: RE | Admit: 2010-03-06 | Discharge: 2010-03-06 | Payer: Self-pay | Admitting: Cardiology

## 2010-04-04 ENCOUNTER — Inpatient Hospital Stay (HOSPITAL_COMMUNITY): Admission: EM | Admit: 2010-04-04 | Discharge: 2010-04-12 | Payer: Self-pay | Admitting: Emergency Medicine

## 2010-04-05 ENCOUNTER — Encounter (INDEPENDENT_AMBULATORY_CARE_PROVIDER_SITE_OTHER): Payer: Self-pay | Admitting: Cardiology

## 2010-04-11 ENCOUNTER — Encounter: Payer: Self-pay | Admitting: Internal Medicine

## 2010-04-12 ENCOUNTER — Ambulatory Visit: Payer: Self-pay | Admitting: Thoracic Surgery (Cardiothoracic Vascular Surgery)

## 2010-04-17 ENCOUNTER — Encounter: Payer: Self-pay | Admitting: Internal Medicine

## 2010-06-06 ENCOUNTER — Ambulatory Visit (HOSPITAL_COMMUNITY): Admission: RE | Admit: 2010-06-06 | Discharge: 2010-06-06 | Payer: Self-pay | Admitting: Internal Medicine

## 2010-07-05 ENCOUNTER — Ambulatory Visit: Payer: Self-pay | Admitting: Internal Medicine

## 2010-07-22 ENCOUNTER — Encounter: Payer: Self-pay | Admitting: Internal Medicine

## 2010-09-30 ENCOUNTER — Inpatient Hospital Stay (HOSPITAL_COMMUNITY): Admission: AD | Admit: 2010-09-30 | Discharge: 2010-10-07 | Payer: Self-pay | Admitting: Cardiology

## 2010-10-01 ENCOUNTER — Encounter: Payer: Self-pay | Admitting: Cardiothoracic Surgery

## 2010-10-01 ENCOUNTER — Ambulatory Visit: Payer: Self-pay | Admitting: Cardiothoracic Surgery

## 2010-10-07 ENCOUNTER — Encounter: Payer: Self-pay | Admitting: Cardiothoracic Surgery

## 2010-10-08 ENCOUNTER — Encounter: Payer: Self-pay | Admitting: Cardiothoracic Surgery

## 2010-10-31 DEATH — deceased

## 2010-12-18 ENCOUNTER — Ambulatory Visit: Admit: 2010-12-18 | Payer: Self-pay | Admitting: Cardiothoracic Surgery

## 2010-12-21 ENCOUNTER — Encounter: Payer: Self-pay | Admitting: Cardiology

## 2011-01-02 NOTE — Assessment & Plan Note (Signed)
Summary: rov 2 months///kp   Visit Type:  Follow-up Copy to:  Dr. Marchelle Gearing, Dr. Sharyn Lull Primary Provider/Referring Provider:  Dr. Wylene Simmer - PMD, Dr Loreta AveSandria Manly, Dr, Sharyn Lull - Cards, Dr. Marchelle Gearing  - Pulmonary  CC:  Pt here for 2 month follow-up. Pt states breahting is the same and no worse. [name]  History of Present Illness: Ov 02/22/2010: [place] COPD. Since restarting spiriva at last visit, dyspnea has returned to pre-stoppage baseline. Continueing symbicort but having $ issues. Feels well. Has started attending pulmonary rehab and is satisfied with it. He has seen Dr. Craige Cotta for sleep apnea evaluation. Sleep study recommended but he refuses to have sleep study. Not clear why despite questioning. Also, due to have AA evluation by Dr. Edilia Bo. No new issues  Current Medications (verified): 1)  Adult Aspirin Low Strength 81 Mg  Tbdp (Aspirin) .... Every Other Day 2)  Protonix 40 Mg  Tbec (Pantoprazole Sodium) .... Take 1 Tablet By Mouth Once A Day 3)  Plavix 75 Mg  Tabs (Clopidogrel Bisulfate) .... Take 1 Tablet By Mouth Once A Day 4)  Celexa 20 Mg  Tabs (Citalopram Hydrobromide) .... Take 1 Tablet By Mouth Once A Day 5)  Nitroquick 0.4 Mg  Subl (Nitroglycerin) .... As Needed 6)  Tavist Allergy 1.34 Mg  Tabs (Clemastine Fumarate) .... Q 12 Hrs As Needed 7)  Spiriva Handihaler 18 Mcg  Caps (Tiotropium Bromide Monohydrate) .... Inhale Contents of 1 Capsule Once A Day 8)  Niaspan 500 Mg  Tbcr (Niacin (Antihyperlipidemic)) .... Two Times A Day 9)  Bystolic 5 Mg  Tabs (Nebivolol Hcl) .... 1/2 Tab Daily 10)  Symbicort 160-4.5 Mcg/act  Aero (Budesonide-Formoterol Fumarate) .... Two Times A Day  Allergies (verified): 1)  ! Pcn 2)  ! Morphine 3)  ! Codeine 4)  ! Lopressor 5)  ! Zofran Odt (Ondansetron)  Past History:  Family History: Last updated: 05/20/2008 1. Daughter died in 28 at the age 74 due to some form of cancer in       the lung.   2. His sister has emphysema.   3. A brother and a  sister suffer allergies.   4. Another sister has asthma.   5. Brother, sister and father have heart disease.   6. He himself has had clotting disorders.   7. Daughter with stomach and lung cancer.   8. Brother with lung cancer.   Social History: Last updated: 2008-05-20 1. He smoked 2 to 3 packs a day for 46 years but quit in 1990.   2. He is married.   3. He has children.   4. He lives at [place].   5. He used to be a Geneticist, molecular in church,   Risk Factors: Smoking Status: quit (12/31/2009)  Past Medical History: # COPD.  In 2002 FEV1 was 2.7L, 87% predicted, FVC was 3.7L, 95% predicted.  August 31, 2007, FEV1 2.17 liters and 80%  predicted, FVC 3.8 liters and 96% predicted, FV1:FVC ratio diminished at  about 57, there was 50% bronchodilator response in both FEV1 and FVC.  His total lung capacity was 5.72 liters, 98% predicted.  U-DLCO  was 13.8/ 69% (corresp hgb 11.2-12.7). Repeat PFTs 05/2008 similar but no bd response. U-DLCO 12.2/59%; corresp hgb 10.1   # CAD -  Status post CABG in 2000.  In December, 2007, he had a myocardial infarction. ,s/p stent placed to his saphenous vein graft to obtuse marginal.  He also had an AICD placed for inducible ventricular tachycardia on  January 07, 2007.  He has not followed up his myocardial infarction with cardiac rehab. s/p angioplasty 08/2007. Recurrence of dyspnea (class 2) after stent. Per Dr. Sharyn Lull cardiac stress test in 01/2008 was normal.  #Dyspnea due to CAD/COPD/Deconditioning > No desaturations on Sept 2008 and walking in office May 2009 and Jan 2011  #GERD.  symptoms were cough and throat burning. Currently in remission  #Anemia since 1988.  IV iron once a month in order to keep his hemoglobin  levels up.  The cause of his anemia is not known. Hgb 10.1/MCV 74 - checked outside on 03/31/2008. Was 11.2gm% in 08/24/2007. Last Iron rx was spring 2010  # Status post left lower extremity DVT 1984.   # Aortic aneurysm on serial  ultrasound followup.  > Dr Edilia Bo eval April 2011  #. Polymyalgia rheumatica.   #. Hx of epistaxis since being on Plavix from January, 2008.   # Hx of Amiodarone early 2008 throughd may/june 2009. STopped by Dr. Sharyn Lull over concerns of dyspnea. CT chest no ILD 6/ 2009. PFT6/2009 shows stable DLCO. No evidence of amio toxicity  #Snoreing and ESS .Roy KitchenMarland KitchenDr Craige Cotta    Past Surgical History: Reviewed history from 10/08/2009 and no changes required. 1. Status post cholecystectomy in 1982.   2. Status post back surgery in 1981.   3. Status post CABG, 5-vessel, 2000.   4. Status post appendectomy in 1946.   5. Status post AICD placement January 27, 2007, for inducible V-tach. -[employer] [phone]     Family History: Reviewed history from 04/28/2008 and no changes required. 1. Daughter died in 46 at the age 45 due to some form of cancer in       the lung.   2. His sister has emphysema.   3. A brother and a sister suffer allergies.   4. Another sister has asthma.   5. Brother, sister and father have heart disease.   6. He himself has had clotting disorders.   7. Daughter with stomach and lung cancer.   8. Brother with lung cancer.   Social History: Reviewed history from 04/28/2008 and no changes required. 1. He smoked 2 to 3 packs a day for 46 years but quit in 1990.   2. He is married.   3. He has children.   4. He lives at [place].   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  The patient denies shortness of breath at rest, productive cough, non-productive cough, coughing up blood, chest pain, irregular heartbeats, acid heartburn, indigestion, loss of appetite, weight change, abdominal pain, difficulty swallowing, sore throat, tooth/dental problems, headaches, nasal congestion/difficulty breathing through nose, sneezing, itching, ear ache, anxiety, depression, hand/feet swelling, joint  stiffness or pain, rash, change in color of mucus, and fever.    Vital Signs:  Patient profile:   75 year old male Height:      68 inches Weight:      177 pounds O2 Sat:      94 % on Room air Temp:     97.3 degrees F oral Pulse rate:   60 / minute BP sitting:   110 / 68  (right arm) Cuff size:   regular  Vitals Entered By: Carron Curie CMA (February 22, 2010 9:02 AM)  O2 Flow:  Room air CC: Pt here for 2 month follow-up. Pt states breahting is the  same, no worse.  Comments Medications reviewed with patient Carron Curie CMA  February 22, 2010 9:05 AM Daytime phone number verified with patient.    Physical Exam  General:  healthy appearing.   Head:  normocephalic and atraumatic Eyes:  PERRLA/EOM intact; conjunctiva and sclera clear Ears:  TMs intact and clear with normal canals Nose:  no deformity, discharge, inflammation, or lesions Mouth:  no deformity or lesions Neck:  no masses, thyromegaly, or abnormal cervical nodes Chest Wall:  no deformities noted Lungs:  clear bilaterally to auscultation and percussion Heart:  regular rate and rhythm, S1, S2 without murmurs, rubs, gallops, or clicks Abdomen:  bowel sounds positive; abdomen soft and non-tender without masses, or organomegaly. Scars of pior surgery present Msk:  no deformity or scoliosis noted with normal posture Pulses:  pulses normal Extremities:  no clubbing, cyanosis, edema, or deformity noted Neurologic:  CN II-XII grossly intact with normal reflexes, coordination, muscle strength and tone Skin:  intact without lesions or rashes Cervical Nodes:  no significant adenopathy Psych:  alert and cooperative; normal mood and affect; normal attention span and concentration   Impression & Recommendations:  Problem # 1:  SNORING (ICD-786.09) Assessment Unchanged  Refusing sleep study. ENcouraged him to reconsider decision. I will cc Dr. Craige Cotta on note  His updated medication list for this problem includes:     Spiriva Handihaler 18 Mcg Caps (Tiotropium bromide monohydrate) ..... Inhale contents of 1 capsule once a day    Bystolic 5 Mg Tabs (Nebivolol hcl) .[name]... 1/2 tab daily    Symbicort 160-4.5 Mcg/act Aero (Budesonide-formoterol fumarate) .[name]..[name] Two times a day  Orders: Est. Patient Level II (16109)  Problem # 2:  SHORTNESS OF BREATH (SOB) (ICD-786.05) Assessment: Unchanged  Back to baseline after rstarting spiriva. Encouraged to attend and complete rehab for residual dyspnea. FOr cards recommended fu with cardiologist. Will see him back in 9 months  Orders: Est. Patient Level II (60454)  Problem # 3:  C O P D (ICD-496) Assessment: Unchanged  DX Based on isolated low DLCO (69% on 08/2007) and CT chest 2007 and June 2009  PLAN Cont Spiriva and symbicort continue rehab rov 9 months  Patient Instructions: 1)  please continue spiriva and symbicort 2)  take symbicort discount coupon 3)  follow with Dr Sharyn Lull for heart issues 4)  follow with Dr. Edilia Bo for aneurysm issues 5)  I will send letter to Dr Edilia Bo 6)  plesae keep up with rehab 7)  please reconsider your decisiion not to have sleep study 8)  please see Dr Craige Cotta as scheduled 9)  return to see me 9 months or sooner if needed

## 2011-01-02 NOTE — Assessment & Plan Note (Signed)
Summary: rov/apc   Visit Type:  Follow-up Primary Provider/Referring Provider:  Dr. Wylene Simmer  CC:  Pt last seen 6/09. Pt here for follow-up. Pt states his breathing is worse. He states he gets SOB when he bends over to tie his shoes.  Pt has not been using spiriva x 2 weeks. Marland Kitchen  History of Present Illness: GI doc: Dr. Loreta Ave Cards: Dr. Sharyn Lull PROBLEM LIST - Jan 2011 1. Shortness of breath due to deconditioning, 40% reduction in DLCO 08/2007 ando 05/2008, #2,  and some component of #3 and #7  2. CAD with decreased ef Sept 2008 following stent occlusion. Reopened -  stent 08/2007. s/p AICD   3. COPD- based on CTchest & Isolated low dlco.  Restarted on Spiriva 03/2008.   4. Isolated low DLCO (20) due to COPD 69% on CT chest. - Sept 2008, reudced to 12/59% in June 2009  5. Chest pain on exertion probably related to coronary artery disease.   6. On amiodarone since early 2008 for atrial fibrillation (DLCO of 69% in 08/2007). Stopped 03/2008 by Dr. Sharyn Lull due to #1. No evidence of amo tox on PFT and CT chest 05/2008  7. Anemia - long standing NOS. Hgb 10.1 in May 2009, 11.4 in 08/2007 8. Six minute walk test of 168 meters without desaturation August 31, 2007. No desaturation in office 03/2008 and 12/2009    Retuns for followup after long time. Last visit was in June 2009. Was doing well. Compliant with meds. Never attended rehab despite advice because of cost. Last 6 months reports worsening dyspnea on exertion. Was able to walk 20 minutes on treadmill at home but now able to do only 15 minutes. Gets exertional chest pain and dyspnea at 7 minutes but able to continue and complete . Seen by Dr. Sharyn Lull 1 month ago and reportedly reassured about heart. Reports heart rate rising to 112/min wiht exercise. Denies edema, paroxysmal nocturanl dyspnea, orthopnea.   Note; also c/o exces snoring and daytime sleepiness. ESS is 14  Current Medications (verified): 1)  Adult Aspirin Low Strength 81 Mg  Tbdp  (Aspirin) .... Every Other Day 2)  Protonix 40 Mg  Tbec (Pantoprazole Sodium) .... Take 1 Tablet By Mouth Once A Day 3)  Plavix 75 Mg  Tabs (Clopidogrel Bisulfate) .... Take 1 Tablet By Mouth Once A Day 4)  Celexa 20 Mg  Tabs (Citalopram Hydrobromide) .... Take 1 Tablet By Mouth Once A Day 5)  Nitroquick 0.4 Mg  Subl (Nitroglycerin) .... As Needed 6)  Tavist Allergy 1.34 Mg  Tabs (Clemastine Fumarate) .... Q 12 Hrs As Needed 7)  Spiriva Handihaler 18 Mcg  Caps (Tiotropium Bromide Monohydrate) .... Inhale Contents of 1 Capsule Once A Day 8)  Niaspan 500 Mg  Tbcr (Niacin (Antihyperlipidemic)) .... Two Times A Day 9)  Bystolic 5 Mg  Tabs (Nebivolol Hcl) .... 1/2 Tab Daily 10)  Symbicort 160-4.5 Mcg/act  Aero (Budesonide-Formoterol Fumarate) .... Two Times A Day  Allergies (verified): 1)  ! Pcn 2)  ! Morphine 3)  ! Codeine 4)  ! Lopressor 5)  ! Zofran Odt (Ondansetron)  Past History:  Past Medical History: Last updated: 05/22/2008 1. COPD.  Per history COPD was diagnosed 10 years ago but he has quit       taking inhalers.  In 2002 FEV1 was 2.7L, 87% predicted, FVC was       3.7L, 95% predicted.. August 31, 2007, FEV1 2.17 liters and 80%  predicted, FVC 3.8 liters and 96% predicted, FV1:FVC  ratio diminished at  about 57, there was 50% bronchodilator response in both FEV1 and FVC.  His total lung capacity was 5.72 liters, 98% predicted.  U-DLCO  was 13.8/ 69% (corresp hgb 11.2-12.7). Repeat PFTs 05/2008 similar but no bd response. U-DLCO 12.2/59%; corresp hgb 10.1   2. Coronary artery disease.  Status post CABG in 2000.  In December,       2007, he had a myocardial infarction, according to him.  Review of       his records he had a stent placed to his saphenous vein graft to       obtuse marginal.  He also had an AICD placed for inducible       ventricular tachycardia on January 07, 2007.  He has not followed       up his myocardial infarction with cardiac rehab. s/p angioplasty 08/2007.  Recurrence of dyspnea (class 2) after stent. Per Dr. Sharyn Lull cardiac stress test in 01/2008 was normal.   3. GERD.  He states this was present in the       past but not now.  He states his reflux symptoms were cough and       throat burning and his current symptoms are not consistent with       acid/GERD.    4. Anemia for the past 20 years.  He states that he has       had to get IV iron once a month in order to keep his hemoglobin       levels up.  The cause of his anemia is not known. Hgb 10.1/MCV 74 - checked outside on 03/31/2008. Was 11.2gm% in 08/24/2007   5. Status post left lower extremity DVT 1984.    6. Aortic aneurysm on serial ultrasound followup.    7. Polymyalgia rheumatica.    8. Hx of epistaxis since being on Plavix from January, 2008.   9. Amiodarone since early 2008. Stopped may/june 2009 by Dr. Sharyn Lull over concerns of dyspnea. CT chest no ILD 6/ 2009. PFT6/2009 shows stable DLCO. No evidence of amio toxicity     Past Surgical History: Last updated: 10/08/2009 1. Status post cholecystectomy in 1982.   2. Status post back surgery in 1981.   3. Status post CABG, 5-vessel, 2000.   4. Status post appendectomy in 1946.   5. Status post AICD placement January 27, 2007, for inducible V-tach. -[employer] [phone]     Family History: Last updated: 2008-04-29 1. Daughter died in 65 at the age 72 due to some form of cancer in       the lung.   2. His sister has emphysema.   3. A brother and a sister suffer allergies.   4. Another sister has asthma.   5. Brother, sister and father have heart disease.   6. He himself has had clotting disorders.   7. Daughter with stomach and lung cancer.   8. Brother with lung cancer.   Social History: Last updated: 04/29/2008 1. He smoked 2 to 3 packs a day for 46 years but quit in 1990.   2. He is married.   3. He has children.   4. He lives at [place].   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 1. Daughter died in 64 at the age 8 due to some form of cancer in  the lung.   2. His sister has emphysema.   3. A brother and a sister suffer allergies.   4. Another sister has asthma.   5. Brother, sister and father have heart disease.   6. He himself has had clotting disorders.   7. Daughter with stomach and lung cancer.   8. Brother with lung cancer.   Social History: Reviewed history from 04/28/2008 and no changes required. 1. He smoked 2 to 3 packs a day for 46 years but quit in 1990.   2. He is married.   3. He has children.   4. He lives at [place].   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  The patient denies shortness of breath at rest, productive cough, coughing up blood, chest pain, irregular heartbeats, acid heartburn, indigestion, loss of appetite, weight change, abdominal pain, difficulty swallowing, sore throat, tooth/dental problems, headaches, nasal congestion/difficulty breathing through nose, sneezing, itching, ear ache, anxiety, depression, hand/feet swelling, joint stiffness or pain, rash, change in color of mucus, and fever.         wheezing  Vital Signs:  Patient profile:   75 year old male Height:      68 inches Weight:      180.38 pounds O2 Sat:      98 % on Room air Temp:     98.1 degrees F oral Pulse rate:   67 / minute BP sitting:   180 / 72  (right arm) Cuff size:   regular  Vitals Entered By: Carron Curie CMA (December 24, 2009 9:29 AM)  O2 Flow:  Room air  Physical Exam  General:  The patient was alert and oriented in no acute distress. HEENT Normal.  Neck veins were flat, carotids were brisk.  Lungs were clear.  Heart sounds were irregula with a 2/6 systolic murmur. The irregularity was as  if PVCs Abdomen was soft with active bowel sounds. There is no clubbing cyanosis or edema. Skin Warm and dry  Head:  normocephalic and atraumatic Eyes:  PERRLA/EOM intact; conjunctiva and sclera clear Ears:  TMs intact and clear with normal canals Nose:  no deformity, discharge, inflammation, or lesions Mouth:  no deformity or lesions Neck:  no masses, thyromegaly, or abnormal cervical nodes Chest Wall:  no deformities noted Lungs:  clear bilaterally to auscultation and percussion Heart:  regular rate and rhythm, S1, S2 without murmurs, rubs, gallops, or clicks Abdomen:  bowel sounds positive; abdomen soft and non-tender without masses, or organomegaly. Scars of pior surgery present Msk:  no deformity or scoliosis noted with normal posture Pulses:  pulses normal Extremities:  no clubbing, cyanosis, edema, or deformity noted Neurologic:  CN II-XII grossly intact with normal reflexes, coordination, muscle strength and tone Skin:  intact without lesions or rashes Cervical Nodes:  no significant adenopathy Psych:  alert and cooperative; normal mood and affect; normal attention span and concentration   Impression & Recommendations:  Problem # 1:  SHORTNESS OF BREATH (SOB) (ICD-786.05) Assessment Deteriorated  Worsening dyspnea: I am not sure why he has decompensated. He only ran out of spiriva 3 weeks ago but had had worsening dhysponea for 6 months. Cardiac and cOPd status appear stable per hx. I suspect deconditioning. Running out of spiriva prbably has contributed. Wonder if he has chronotropic incompetence as well. For sure, underlying anemia, copd, coronary artery  disease are contributors to baseline dyspnea  plan restart spiriva attend pulmonary rehab (medicare now pays for this) rov 3 months will check with Dr. Graciela Husbands if he has pacer and if chronotropic incompetence  is a possibility get hgb restuls from Dr Wylene Simmer  Orders: Est. Patient Level IV (54098)  Problem # 2:   SNORING (ICD-786.09) Assessment: Comment Only  will address at next visit will give him sleep questionaire - ESS 14  plan refer Dr. Craige Cotta The following medications were removed from the medication list:    Furosemide 40 Mg Tabs (Furosemide) ..... Once daily or as needed His updated medication list for this problem includes:    Spiriva Handihaler 18 Mcg Caps (Tiotropium bromide monohydrate) ..... Inhale contents of 1 capsule once a day    Bystolic 5 Mg Tabs (Nebivolol hcl) .Marland Kitchen... 1/2 tab daily    Symbicort 160-4.5 Mcg/act Aero (Budesonide-formoterol fumarate) .Marland Kitchen..Marland Kitchen Two times a day  Orders: Sleep Disorder Referral (Sleep Disorder) Est. Patient Level IV (11914)  Other Orders: Pulmonary Referral (Pulmonary) [id number]  Patient Instructions: 1)  I will talk to Dr Graciela Husbands if there are heart rate issues with shortness of breath 2)  Restart your spiriva once daily 3)  Please attend pulmonary rehab 4)  Return to see me in 2 months 5)  Will get your blood test results from Dr. Wylene Simmer 6)  Please see Dr. Craige Cotta for possible sleep apnea Prescriptions: SPIRIVA HANDIHALER 18 MCG  CAPS (TIOTROPIUM BROMIDE MONOHYDRATE) Inhale contents of 1 capsule once a day  #1 x 6   Entered and Authorized by:   Kalman Shan MD   Signed by:   Kalman Shan MD on 12/24/2009   Method used:   Electronically to        [facility] [address] #[id number]* (retail)       [address] [place], [place]  [address]       Ph: [phone]       Fax: [phone]   RxID:   [id number]   Appended Document: rov/apc    Clinical Lists Changes rx sent to wrong pharmacy resent to correct one. Carron Curie CMA  December 24, 2009 10:16 AM  Medications: Rx of SPIRIVA HANDIHALER 18 MCG  CAPS (TIOTROPIUM BROMIDE MONOHYDRATE) Inhale contents of 1 capsule once a day;  #1 x 6;  Signed;  Entered by: Carron Curie CMA;  Authorized by: Kalman Shan MD;  Method used: Electronically to [facility] [address]  #[id number]*, [address], [place], [place], [place]  [address], Ph: [phone], Fax: [phone]    Prescriptions: SPIRIVA HANDIHALER 18 MCG  CAPS (TIOTROPIUM BROMIDE MONOHYDRATE) Inhale contents of 1 capsule once a day  #1 x 6   Entered by:   Carron Curie CMA   Authorized by:   Kalman Shan MD   Signed by:   Carron Curie CMA on 12/24/2009   Method used:   Electronically to        [facility] [address] [facility] [address] #[id number]* (retail)       [address]       [place], [place]  [address]       Ph: [phone]       Fax: [phone]   RxID:   [id number]    Appended Document: rov/apc Ambulatory Pulse Oximetry  Resting; HR_67____    02 Sat__98___  Lap1 (185 feet)   HR_100____   02 Sat___93__ Lap2 (185 feet)  HR___105__   02 Sat___94__    Lap3 (185 feet)   HR__110___   02 Sat__96___  __x_Test Completed without Difficulty ___Test Stopped due to:

## 2011-01-02 NOTE — Letter (Signed)
Summary: Remote Device Check  [facility], Main Office  [address]   [place], [place] [address]   Phone: [phone]  Fax: [phone]     July 22, 2010 MRN: [id number]   [name] [address] [place], [place]  [address]   Dear [name],   Your remote transmission was recieved and reviewed by your physician.  All diagnostics were within normal limits for you.  ___X___Your next office visit is scheduled for:   November with Dr Graciela Husbands. Please call our office to schedule an appointment.    Sincerely,  Vella Kohler

## 2011-01-02 NOTE — Assessment & Plan Note (Signed)
Summary: SLEEP CONSULT///kp   Copy to:  Dr. Marchelle Gearing, Dr. Sharyn Lull Primary Provider/Referring Provider:  Dr. Wylene Simmer  CC:  Sleep consult. Epworth score is 6..  History of Present Illness: 75 yo male for sleep evaluation.  He has noticed trouble with his sleep for years.    He goes to bed at 10pm.  he sometimes has trouble falling asleep.  He has tried taking tylenol or alleve to help with neck and shoulder pains.  He will sleep for about 2 hours, and then wake up feeling like he can't catch his breath.  He will also use the bathroom.  He can usually fall back to sleep after this.  He wakes up at 545 am, but still feels sleepy.  He denies headaches.  He drinks two cups of coffee in the morning.  He will sometimes nap in the afternoon.  He does snore, and wakes up hearing himself snore.  He has trouble sleeping on his back.  He gets a dry mouth at night, and has been told he talks in his sleep.  He also kicks his legs alot while asleep.   His weight has been steady.  His mood is okay.  He denies sleep walking or nightmares.  There is no history of restless legs.  He denies sleep hallucinations, sleep paralysis, or cataplexy.  He does not have problem with his driving.   Preventive Screening-Counseling & Management  Alcohol-Tobacco     Smoking Status: quit     Year Quit: 1992     Pack years: 40 yrs x3 ppd  Current Medications (verified): 1)  Adult Aspirin Low Strength 81 Mg  Tbdp (Aspirin) .... Every Other Day 2)  Protonix 40 Mg  Tbec (Pantoprazole Sodium) .... Take 1 Tablet By Mouth Once A Day 3)  Plavix 75 Mg  Tabs (Clopidogrel Bisulfate) .... Take 1 Tablet By Mouth Once A Day 4)  Celexa 20 Mg  Tabs (Citalopram Hydrobromide) .... Take 1 Tablet By Mouth Once A Day 5)  Nitroquick 0.4 Mg  Subl (Nitroglycerin) .... As Needed 6)  Tavist Allergy 1.34 Mg  Tabs (Clemastine Fumarate) .... Q 12 Hrs As Needed 7)  Spiriva Handihaler 18 Mcg  Caps (Tiotropium Bromide Monohydrate) .... Inhale  Contents of 1 Capsule Once A Day 8)  Niaspan 500 Mg  Tbcr (Niacin (Antihyperlipidemic)) .... Two Times A Day 9)  Bystolic 5 Mg  Tabs (Nebivolol Hcl) .... 1/2 Tab Daily 10)  Symbicort 160-4.5 Mcg/act  Aero (Budesonide-Formoterol Fumarate) .... Two Times A Day  Allergies (verified): 1)  ! Pcn 2)  ! Morphine 3)  ! Codeine 4)  ! Lopressor 5)  ! Zofran Odt (Ondansetron)  Past History:  Past Medical History: 1. COPD.  Per history COPD was diagnosed 10 years ago but he has quit  taking inhalers.  In 2002 FEV1 was 2.7L, 87% predicted, FVC was 3.7L, 95% predicted.  August 31, 2007, FEV1 2.17 liters and 80%  predicted, FVC 3.8 liters and 96% predicted, FV1:FVC ratio diminished at  about 57, there was 50% bronchodilator response in both FEV1 and FVC.  His total lung capacity was 5.72 liters, 98% predicted.  U-DLCO  was 13.8/ 69% (corresp hgb 11.2-12.7). Repeat PFTs 05/2008 similar but no bd response. U-DLCO 12.2/59%; corresp hgb 10.1   2. Coronary artery disease.  Status post CABG in 2000.  In December, 2007, he had a myocardial infarction, according to him.  Review of his records he had a stent placed to his saphenous vein graft to  obtuse marginal.  He also had an AICD placed for inducible ventricular tachycardia on January 07, 2007.  He has not followed up his myocardial infarction with cardiac rehab. s/p angioplasty 08/2007. Recurrence of dyspnea (class 2) after stent. Per Dr. Sharyn Lull cardiac stress test in 01/2008 was normal.   3. GERD.  He states this was present in the past but not now.  He states his reflux symptoms were cough and throat burning and his current symptoms are not consistent with acid/GERD.    4. Anemia for the past 20 years.  He states that he has had to get IV iron once a month in order to keep his hemoglobin  levels up.  The cause of his anemia is not known. Hgb 10.1/MCV 74 - checked outside on 03/31/2008. Was 11.2gm% in 08/24/2007   5. Status post left lower extremity DVT 1984.     6. Aortic aneurysm on serial ultrasound followup.    7. Polymyalgia rheumatica.    8. Hx of epistaxis since being on Plavix from January, 2008.   9. Amiodarone since early 2008. Stopped may/june 2009 by Dr. Sharyn Lull over concerns of dyspnea. CT chest no ILD 6/ 2009. PFT6/2009 shows stable DLCO. No evidence of amio toxicity     Past Surgical History: Reviewed history from 10/08/2009 and no changes required. 1. Status post cholecystectomy in 1982.   2. Status post back surgery in 1981.   3. Status post CABG, 5-vessel, 2000.   4. Status post appendectomy in 1946.   5. Status post AICD placement January 27, 2007, for inducible V-tach. -[employer] [phone]     Family History: Reviewed history from 04/28/2008 and no changes required. 1. Daughter died in 73 at the age 65 due to some form of cancer in       the lung.   2. His sister has emphysema.   3. A brother and a sister suffer allergies.   4. Another sister has asthma.   5. Brother, sister and father have heart disease.   6. He himself has had clotting disorders.   7. Daughter with stomach and lung cancer.   8. Brother with lung cancer.   Social History: Reviewed history from 04/28/2008 and no changes required. 1. He smoked 2 to 3 packs a day for 46 years but quit in 1990.   2. He is married.   3. He has children.   4. He lives at [place].   5. 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  The patient denies shortness of breath at rest, non-productive cough, coughing up blood, acid heartburn, loss of appetite, weight change, difficulty swallowing, sore throat, tooth/dental problems, headaches, ear ache, anxiety, depression, hand/feet  swelling, rash, change in color of mucus, and fever.    Vital Signs:  Patient profile:   75 year old male Height:      68 inches (172.72 cm) Weight:      179.31 pounds (36.05 kg) BMI:     12.10 O2 Sat:      95 % on Room air Temp:     98.0 degrees F (36.67 degrees C) oral Pulse rate:   74 / minute BP sitting:   118 / 72  (right  arm) Cuff size:   regular  Vitals Entered By: Michel Bickers CMA (December 31, 2009 11:38 AM)  O2 Sat at Rest %:  95 O2 Flow:  Room air CC: Sleep consult. Epworth score is 6.   Physical Exam  General:  healthy appearing.   Eyes:  PERRLA/EOM intact; conjunctiva and sclera clear Ears:  TMs intact and clear with normal canals Nose:  no deformity, discharge, inflammation, or lesions Mouth:  no deformity or lesions Neck:  no masses, thyromegaly, or abnormal cervical nodes Lungs:  clear bilaterally to auscultation and percussion Heart:  regular rate and rhythm, S1, S2 without murmurs, rubs, gallops, or clicks Abdomen:  bowel sounds positive; abdomen soft and non-tender without masses, or organomegaly. Scars of pior surgery present Extremities:  no clubbing, cyanosis, edema, or deformity noted Neurologic:  CN II-XII grossly intact with normal reflexes, coordination, muscle strength and tone Cervical Nodes:  no significant adenopathy Psych:  alert and cooperative; normal mood and affect; normal attention span and concentration   Impression & Recommendations:  Problem # 1:  SNORING (ICD-786.09) He has symptoms as well as physical findings suggestive of sleep apnea.  He does also have cardiovascular disease.  To further assess this I will schedule him for a sleep study.  I have explained to him the importance of maintaining his weight, and driving precautions were reviewed.  Complete Medication List: 1)  Adult Aspirin Low Strength 81 Mg Tbdp (Aspirin) .... Every other day 2)  Protonix 40 Mg Tbec (Pantoprazole sodium) .... Take 1 tablet by mouth once a day 3)   Plavix 75 Mg Tabs (Clopidogrel bisulfate) .... Take 1 tablet by mouth once a day 4)  Celexa 20 Mg Tabs (Citalopram hydrobromide) .... Take 1 tablet by mouth once a day 5)  Nitroquick 0.4 Mg Subl (Nitroglycerin) .... As needed 6)  Tavist Allergy 1.34 Mg Tabs (Clemastine fumarate) .... Q 12 hrs as needed 7)  Spiriva Handihaler 18 Mcg Caps (Tiotropium bromide monohydrate) .... Inhale contents of 1 capsule once a day 8)  Niaspan 500 Mg Tbcr (Niacin (antihyperlipidemic)) .... Two times a day 9)  Bystolic 5 Mg Tabs (Nebivolol hcl) .... 1/2 tab daily 10)  Symbicort 160-4.5 Mcg/act Aero (Budesonide-formoterol fumarate) .... Two times a day  Other Orders: Est. Patient Level IV ([address]) Sleep Disorder Referral (Sleep Disorder)  Patient Instructions: 1)  Will schedule sleep test 2)  Will call to schedule follow up after sleep test is read

## 2011-01-02 NOTE — Letter (Signed)
Summary: Remote Device Check  [facility], Main Office  [address]   [place], [place] [address]   Phone: [phone]  Fax: [phone]     Apr 11, 2010 MRN: [id number]   [name] [address] [place], [place]  [address]   Dear [name],    __X___Your next transmission is scheduled for:  07-11-2010.  Please transmit at any time this day.  If you have a wireless device your transmission will be sent automatically.    Sincerely,  Vella Kohler

## 2011-01-02 NOTE — Miscellaneous (Signed)
Summary: cardiac rehab referral  Clinical Lists Changes  Orders: Added new Referral order of Cardiology Referral (Cardiology) - Signed

## 2011-01-02 NOTE — Cardiovascular Report (Signed)
Summary: Office Visit Remote   Office Visit Remote   Imported By: Roderic Ovens 07/23/2010 15:43:35  _____________________________________________________________________  External Attachment:    Type:   Image     Comment:   External Document

## 2011-01-02 NOTE — Letter (Signed)
Summary: Remote Device Check  [facility], Main Office  [address]   [place], [place] [address]   Phone: [phone]  Fax: [phone]     January 25, 2010 MRN: [id number]   [name] [address] [place], [place]  [address]   Dear [name],   Your remote transmission was recieved and reviewed by your physician.  All diagnostics were within normal limits for you.  __X___Your next transmission is scheduled for: Apr 09, 2010.  Please transmit at any time this day.  If you have a wireless device your transmission will be sent automatically.      Sincerely,  Proofreader

## 2011-01-02 NOTE — Cardiovascular Report (Signed)
Summary: Office Visit Remote   Office Visit Remote   Imported By: Roderic Ovens 01/25/2010 13:55:33  _____________________________________________________________________  External Attachment:    Type:   Image     Comment:   External Document

## 2011-02-12 LAB — POCT I-STAT 7, (LYTES, BLD GAS, ICA,H+H)
Calcium, Ion: 1.03 mmol/L — ABNORMAL LOW (ref 1.12–1.32)
HCT: 25 % — ABNORMAL LOW (ref 39.0–52.0)
Hemoglobin: 6.1 g/dL — CL (ref 13.0–17.0)
Hemoglobin: 8.5 g/dL — ABNORMAL LOW (ref 13.0–17.0)
Potassium: 3.8 mEq/L (ref 3.5–5.1)
Potassium: 4.3 mEq/L (ref 3.5–5.1)
Sodium: 144 mEq/L (ref 135–145)
TCO2: 24 mmol/L (ref 0–100)
TCO2: 26 mmol/L (ref 0–100)
pCO2 arterial: 51.3 mmHg — ABNORMAL HIGH (ref 35.0–45.0)
pCO2 arterial: 59.1 mmHg (ref 35.0–45.0)
pH, Arterial: 7.181 — CL (ref 7.350–7.450)
pH, Arterial: 7.283 — ABNORMAL LOW (ref 7.350–7.450)
pO2, Arterial: 139 mmHg — ABNORMAL HIGH (ref 80.0–100.0)

## 2011-02-12 LAB — PREPARE CRYOPRECIPITATE
Unit division: 0
Unit division: 0
Unit division: 0

## 2011-02-12 LAB — PREPARE PLATELETS: Unit division: 0

## 2011-02-12 LAB — URINALYSIS, DIPSTICK ONLY
Bilirubin Urine: NEGATIVE
Glucose, UA: NEGATIVE mg/dL
Hgb urine dipstick: NEGATIVE
Ketones, ur: NEGATIVE mg/dL
Leukocytes, UA: NEGATIVE
Nitrite: NEGATIVE
Protein, ur: NEGATIVE mg/dL
Specific Gravity, Urine: 1.018 (ref 1.005–1.030)
Urobilinogen, UA: 0.2 mg/dL (ref 0.0–1.0)
pH: 6 (ref 5.0–8.0)

## 2011-02-12 LAB — CBC
HCT: 33.2 % — ABNORMAL LOW (ref 39.0–52.0)
HCT: 34.6 % — ABNORMAL LOW (ref 39.0–52.0)
HCT: 34.8 % — ABNORMAL LOW (ref 39.0–52.0)
HCT: 35 % — ABNORMAL LOW (ref 39.0–52.0)
HCT: 35.4 % — ABNORMAL LOW (ref 39.0–52.0)
HCT: 35.8 % — ABNORMAL LOW (ref 39.0–52.0)
HCT: 39.5 % (ref 39.0–52.0)
HCT: 39.9 % (ref 39.0–52.0)
Hemoglobin: 10.8 g/dL — ABNORMAL LOW (ref 13.0–17.0)
Hemoglobin: 11.1 g/dL — ABNORMAL LOW (ref 13.0–17.0)
Hemoglobin: 11.2 g/dL — ABNORMAL LOW (ref 13.0–17.0)
Hemoglobin: 11.3 g/dL — ABNORMAL LOW (ref 13.0–17.0)
Hemoglobin: 11.5 g/dL — ABNORMAL LOW (ref 13.0–17.0)
Hemoglobin: 11.7 g/dL — ABNORMAL LOW (ref 13.0–17.0)
MCH: 28.4 pg (ref 26.0–34.0)
MCH: 28.5 pg (ref 26.0–34.0)
MCH: 28.9 pg (ref 26.0–34.0)
MCH: 29 pg (ref 26.0–34.0)
MCHC: 32.2 g/dL (ref 30.0–36.0)
MCHC: 32.6 g/dL (ref 30.0–36.0)
MCHC: 32.7 g/dL (ref 30.0–36.0)
MCHC: 32.7 g/dL (ref 30.0–36.0)
MCV: 87.1 fL (ref 78.0–100.0)
MCV: 87.3 fL (ref 78.0–100.0)
MCV: 87.5 fL (ref 78.0–100.0)
MCV: 87.8 fL (ref 78.0–100.0)
MCV: 88.5 fL (ref 78.0–100.0)
MCV: 88.6 fL (ref 78.0–100.0)
MCV: 88.9 fL (ref 78.0–100.0)
Platelets: 162 10*3/uL (ref 150–400)
Platelets: 162 10*3/uL (ref 150–400)
Platelets: 177 10*3/uL (ref 150–400)
Platelets: 183 10*3/uL (ref 150–400)
RBC: 3.84 MIL/uL — ABNORMAL LOW (ref 4.22–5.81)
RBC: 3.89 MIL/uL — ABNORMAL LOW (ref 4.22–5.81)
RBC: 3.93 MIL/uL — ABNORMAL LOW (ref 4.22–5.81)
RBC: 3.99 MIL/uL — ABNORMAL LOW (ref 4.22–5.81)
RBC: 4.46 MIL/uL (ref 4.22–5.81)
RDW: 15.3 % (ref 11.5–15.5)
RDW: 15.4 % (ref 11.5–15.5)
RDW: 15.5 % (ref 11.5–15.5)
RDW: 15.5 % (ref 11.5–15.5)
RDW: 15.8 % — ABNORMAL HIGH (ref 11.5–15.5)
WBC: 6.4 10*3/uL (ref 4.0–10.5)
WBC: 6.5 10*3/uL (ref 4.0–10.5)
WBC: 6.6 10*3/uL (ref 4.0–10.5)
WBC: 6.8 10*3/uL (ref 4.0–10.5)
WBC: 7.2 10*3/uL (ref 4.0–10.5)
WBC: 8.2 10*3/uL (ref 4.0–10.5)

## 2011-02-12 LAB — POCT I-STAT 4, (NA,K, GLUC, HGB,HCT)
Glucose, Bld: 103 mg/dL — ABNORMAL HIGH (ref 70–99)
Glucose, Bld: 104 mg/dL — ABNORMAL HIGH (ref 70–99)
Glucose, Bld: 162 mg/dL — ABNORMAL HIGH (ref 70–99)
Glucose, Bld: 188 mg/dL — ABNORMAL HIGH (ref 70–99)
HCT: 21 % — ABNORMAL LOW (ref 39.0–52.0)
HCT: 24 % — ABNORMAL LOW (ref 39.0–52.0)
HCT: 29 % — ABNORMAL LOW (ref 39.0–52.0)
Hemoglobin: 11.2 g/dL — ABNORMAL LOW (ref 13.0–17.0)
Hemoglobin: 8.2 g/dL — ABNORMAL LOW (ref 13.0–17.0)
Hemoglobin: 9.9 g/dL — ABNORMAL LOW (ref 13.0–17.0)
Hemoglobin: 9.9 g/dL — ABNORMAL LOW (ref 13.0–17.0)
Potassium: 3.6 mEq/L (ref 3.5–5.1)
Potassium: 4.4 mEq/L (ref 3.5–5.1)
Potassium: 5.3 mEq/L — ABNORMAL HIGH (ref 3.5–5.1)
Potassium: 5.9 mEq/L — ABNORMAL HIGH (ref 3.5–5.1)
Sodium: 137 mEq/L (ref 135–145)
Sodium: 140 mEq/L (ref 135–145)
Sodium: 140 mEq/L (ref 135–145)
Sodium: 141 mEq/L (ref 135–145)

## 2011-02-12 LAB — CROSSMATCH
ABO/RH(D): A POS
Antibody Screen: NEGATIVE
Unit division: 0
Unit division: 0
Unit division: 0
Unit division: 0
Unit division: 0
Unit division: 0
Unit division: 0
Unit division: 0

## 2011-02-12 LAB — POCT I-STAT GLUCOSE
Glucose, Bld: 106 mg/dL — ABNORMAL HIGH (ref 70–99)
Glucose, Bld: 108 mg/dL — ABNORMAL HIGH (ref 70–99)
Glucose, Bld: 99 mg/dL (ref 70–99)
Operator id: 230421
Operator id: 230421
Operator id: 3406
Operator id: 3406

## 2011-02-12 LAB — PREPARE FRESH FROZEN PLASMA: Unit division: 0

## 2011-02-12 LAB — BASIC METABOLIC PANEL
BUN: 11 mg/dL (ref 6–23)
BUN: 8 mg/dL (ref 6–23)
BUN: 8 mg/dL (ref 6–23)
CO2: 26 mEq/L (ref 19–32)
CO2: 27 mEq/L (ref 19–32)
CO2: 28 mEq/L (ref 19–32)
CO2: 28 mEq/L (ref 19–32)
Calcium: 8.7 mg/dL (ref 8.4–10.5)
Calcium: 8.7 mg/dL (ref 8.4–10.5)
Calcium: 8.9 mg/dL (ref 8.4–10.5)
Chloride: 104 mEq/L (ref 96–112)
Chloride: 106 mEq/L (ref 96–112)
Chloride: 106 mEq/L (ref 96–112)
Creatinine, Ser: 1.01 mg/dL (ref 0.4–1.5)
Creatinine, Ser: 1.05 mg/dL (ref 0.4–1.5)
Creatinine, Ser: 1.11 mg/dL (ref 0.4–1.5)
GFR calc Af Amer: >60 mL/min (ref >60)
GFR calc Af Amer: >60 mL/min (ref >60)
GFR calc non Af Amer: >60 mL/min (ref >60)
GFR calc non Af Amer: >60 mL/min (ref >60)
GFR calc non Af Amer: >60 mL/min (ref >60)
Glucose, Bld: 109 mg/dL — ABNORMAL HIGH (ref 70–99)
Glucose, Bld: 116 mg/dL — ABNORMAL HIGH (ref 70–99)
Glucose, Bld: 123 mg/dL — ABNORMAL HIGH (ref 70–99)
Potassium: 4 mEq/L (ref 3.5–5.1)
Potassium: 4.1 mEq/L (ref 3.5–5.1)
Potassium: 4.1 mEq/L (ref 3.5–5.1)
Potassium: 4.3 mEq/L (ref 3.5–5.1)
Sodium: 137 mEq/L (ref 135–145)
Sodium: 139 mEq/L (ref 135–145)
Sodium: 140 mEq/L (ref 135–145)
Sodium: 141 mEq/L (ref 135–145)

## 2011-02-12 LAB — COMPREHENSIVE METABOLIC PANEL
Alkaline Phosphatase: 57 U/L (ref 39–117)
BUN: 14 mg/dL (ref 6–23)
Calcium: 9.2 mg/dL (ref 8.4–10.5)
Creatinine, Ser: 1.16 mg/dL (ref 0.4–1.5)
Glucose, Bld: 109 mg/dL — ABNORMAL HIGH (ref 70–99)
Total Protein: 6.6 g/dL (ref 6.0–8.3)

## 2011-02-12 LAB — HEPARIN LEVEL (UNFRACTIONATED)
Heparin Unfractionated: 0.15 IU/mL — ABNORMAL LOW (ref 0.30–0.70)
Heparin Unfractionated: 0.16 IU/mL — ABNORMAL LOW (ref 0.30–0.70)
Heparin Unfractionated: 0.35 IU/mL (ref 0.30–0.70)
Heparin Unfractionated: 0.37 IU/mL (ref 0.30–0.70)
Heparin Unfractionated: 0.39 IU/mL (ref 0.30–0.70)

## 2011-02-12 LAB — POCT I-STAT 3, ART BLOOD GAS (G3+)
Acid-Base Excess: 1 mmol/L (ref 0.0–2.0)
Acid-Base Excess: >30 mmol/L — ABNORMAL HIGH (ref 0.0–2.0)
Acid-base deficit: 5 mmol/L — ABNORMAL HIGH (ref 0.0–2.0)
Bicarbonate: 25.5 mEq/L — ABNORMAL HIGH (ref 20.0–24.0)
Bicarbonate: 26.5 mEq/L — ABNORMAL HIGH (ref 20.0–24.0)
O2 Saturation: 100 %
O2 Saturation: 84 %
O2 Saturation: 94 %
TCO2: 27 mmol/L (ref 0–100)
TCO2: >50 mmol/L (ref 0–100)
pCO2 arterial: 41.5 mmHg (ref 35.0–45.0)
pCO2 arterial: 57.7 mmHg (ref 35.0–45.0)
pCO2 arterial: 57.8 mmHg (ref 35.0–45.0)
pH, Arterial: 7.182 — CL (ref 7.350–7.450)
pO2, Arterial: 143 mmHg — ABNORMAL HIGH (ref 80.0–100.0)
pO2, Arterial: 257 mmHg — ABNORMAL HIGH (ref 80.0–100.0)
pO2, Arterial: 40 mmHg — ABNORMAL LOW (ref 80.0–100.0)
pO2, Arterial: 80 mmHg (ref 80.0–100.0)

## 2011-02-12 LAB — PLATELET INHIBITION P2Y12
P2Y12 % Inhibition: 5 %
Platelet Function  P2Y12: 263 [PRU] (ref 194–418)
Platelet Function  P2Y12: 303 [PRU] (ref 194–418)
Platelet Function Baseline: 275 [PRU] (ref 194–418)
Platelet Function Baseline: 355 [PRU] (ref 194–418)

## 2011-02-12 LAB — HEMOGLOBIN AND HEMATOCRIT, BLOOD
HCT: 24.1 % — ABNORMAL LOW (ref 39.0–52.0)
Hemoglobin: 8.3 g/dL — ABNORMAL LOW (ref 13.0–17.0)

## 2011-02-12 LAB — CARDIAC PANEL(CRET KIN+CKTOT+MB+TROPI)
CK, MB: 1.5 ng/mL (ref 0.3–4.0)
Relative Index: INVALID (ref 0.0–2.5)
Relative Index: INVALID (ref 0.0–2.5)
Troponin I: 0.02 ng/mL (ref 0.00–0.06)
Troponin I: <0.01 ng/mL (ref 0.00–0.06)
Troponin I: <0.01 ng/mL (ref 0.00–0.06)

## 2011-02-12 LAB — HEMOGLOBIN A1C: Hgb A1c MFr Bld: 6 % — ABNORMAL HIGH (ref <5.7)

## 2011-02-12 LAB — LIPID PANEL
Cholesterol: 169 mg/dL (ref 0–200)
Total CHOL/HDL Ratio: 4 RATIO

## 2011-02-12 LAB — BLOOD GAS, ARTERIAL
Acid-Base Excess: 0.3 mmol/L (ref 0.0–2.0)
Drawn by: 332471
pO2, Arterial: 114 mmHg — ABNORMAL HIGH (ref 80.0–100.0)

## 2011-02-12 LAB — PROTIME-INR
INR: 0.99 (ref 0.00–1.49)
Prothrombin Time: 13.3 seconds (ref 11.6–15.2)

## 2011-02-12 LAB — PLATELET COUNT: Platelets: 62 10*3/uL — ABNORMAL LOW (ref 150–400)

## 2011-02-18 LAB — DIFFERENTIAL
Basophils Absolute: 0 10*3/uL (ref 0.0–0.1)
Basophils Absolute: 0 10*3/uL (ref 0.0–0.1)
Basophils Absolute: 0 10*3/uL (ref 0.0–0.1)
Basophils Absolute: 0.1 10*3/uL (ref 0.0–0.1)
Basophils Relative: 1 % (ref 0–1)
Basophils Relative: 1 % (ref 0–1)
Eosinophils Absolute: 0.1 10*3/uL (ref 0.0–0.7)
Eosinophils Absolute: 0.4 10*3/uL (ref 0.0–0.7)
Eosinophils Absolute: 0.5 10*3/uL (ref 0.0–0.7)
Eosinophils Relative: 2 % (ref 0–5)
Lymphocytes Relative: 19 % (ref 12–46)
Lymphocytes Relative: 21 % (ref 12–46)
Lymphocytes Relative: 26 % (ref 12–46)
Lymphs Abs: 1.1 10*3/uL (ref 0.7–4.0)
Lymphs Abs: 1.3 10*3/uL (ref 0.7–4.0)
Lymphs Abs: 1.4 10*3/uL (ref 0.7–4.0)
Lymphs Abs: 1.6 10*3/uL (ref 0.7–4.0)
Lymphs Abs: 1.7 10*3/uL (ref 0.7–4.0)
Lymphs Abs: 1.8 10*3/uL (ref 0.7–4.0)
Monocytes Absolute: 0.9 10*3/uL (ref 0.1–1.0)
Monocytes Absolute: 1.1 10*3/uL — ABNORMAL HIGH (ref 0.1–1.0)
Monocytes Relative: 15 % — ABNORMAL HIGH (ref 3–12)
Monocytes Relative: 19 % — ABNORMAL HIGH (ref 3–12)
Neutro Abs: 3 10*3/uL (ref 1.7–7.7)
Neutro Abs: 3.2 10*3/uL (ref 1.7–7.7)
Neutro Abs: 3.7 10*3/uL (ref 1.7–7.7)
Neutro Abs: 3.9 10*3/uL (ref 1.7–7.7)
Neutrophils Relative %: 40 % — ABNORMAL LOW (ref 43–77)
Neutrophils Relative %: 58 % (ref 43–77)
Neutrophils Relative %: 62 % (ref 43–77)
Neutrophils Relative %: 68 % (ref 43–77)

## 2011-02-18 LAB — POCT I-STAT, CHEM 8
Chloride: 108 mEq/L (ref 96–112)
Creatinine, Ser: 1.1 mg/dL (ref 0.4–1.5)
HCT: 32 % — ABNORMAL LOW (ref 39.0–52.0)
Hemoglobin: 10.9 g/dL — ABNORMAL LOW (ref 13.0–17.0)
Potassium: 4.5 mEq/L (ref 3.5–5.1)
Sodium: 139 mEq/L (ref 135–145)

## 2011-02-18 LAB — POCT CARDIAC MARKERS
CKMB, poc: 2.7 ng/mL (ref 1.0–8.0)
Myoglobin, poc: 89.6 ng/mL (ref 12–200)

## 2011-02-18 LAB — CBC
HCT: 26 % — ABNORMAL LOW (ref 39.0–52.0)
HCT: 27.6 % — ABNORMAL LOW (ref 39.0–52.0)
HCT: 28.5 % — ABNORMAL LOW (ref 39.0–52.0)
HCT: 29.1 % — ABNORMAL LOW (ref 39.0–52.0)
Hemoglobin: 10.5 g/dL — ABNORMAL LOW (ref 13.0–17.0)
Hemoglobin: 8.3 g/dL — ABNORMAL LOW (ref 13.0–17.0)
Hemoglobin: 8.4 g/dL — ABNORMAL LOW (ref 13.0–17.0)
Hemoglobin: 9.1 g/dL — ABNORMAL LOW (ref 13.0–17.0)
Hemoglobin: 9.1 g/dL — ABNORMAL LOW (ref 13.0–17.0)
Hemoglobin: 9.4 g/dL — ABNORMAL LOW (ref 13.0–17.0)
Hemoglobin: 9.4 g/dL — ABNORMAL LOW (ref 13.0–17.0)
Hemoglobin: 9.7 g/dL — ABNORMAL LOW (ref 13.0–17.0)
MCHC: 32.7 g/dL (ref 30.0–36.0)
MCHC: 32.9 g/dL (ref 30.0–36.0)
MCHC: 33 g/dL (ref 30.0–36.0)
MCHC: 33.3 g/dL (ref 30.0–36.0)
MCV: 76.5 fL — ABNORMAL LOW (ref 78.0–100.0)
MCV: 76.7 fL — ABNORMAL LOW (ref 78.0–100.0)
MCV: 76.8 fL — ABNORMAL LOW (ref 78.0–100.0)
MCV: 76.8 fL — ABNORMAL LOW (ref 78.0–100.0)
MCV: 76.9 fL — ABNORMAL LOW (ref 78.0–100.0)
MCV: 79 fL (ref 78.0–100.0)
Platelets: 185 10*3/uL (ref 150–400)
Platelets: 207 10*3/uL (ref 150–400)
Platelets: 210 10*3/uL (ref 150–400)
Platelets: 229 10*3/uL (ref 150–400)
RBC: 3.32 MIL/uL — ABNORMAL LOW (ref 4.22–5.81)
RBC: 3.64 MIL/uL — ABNORMAL LOW (ref 4.22–5.81)
RBC: 3.64 MIL/uL — ABNORMAL LOW (ref 4.22–5.81)
RBC: 3.66 MIL/uL — ABNORMAL LOW (ref 4.22–5.81)
RBC: 3.77 MIL/uL — ABNORMAL LOW (ref 4.22–5.81)
RBC: 4.04 MIL/uL — ABNORMAL LOW (ref 4.22–5.81)
RDW: 16.1 % — ABNORMAL HIGH (ref 11.5–15.5)
RDW: 16.6 % — ABNORMAL HIGH (ref 11.5–15.5)
RDW: 16.7 % — ABNORMAL HIGH (ref 11.5–15.5)
RDW: 16.8 % — ABNORMAL HIGH (ref 11.5–15.5)
RDW: 18.2 % — ABNORMAL HIGH (ref 11.5–15.5)
WBC: 5.6 10*3/uL (ref 4.0–10.5)
WBC: 5.6 10*3/uL (ref 4.0–10.5)
WBC: 5.7 10*3/uL (ref 4.0–10.5)
WBC: 5.9 10*3/uL (ref 4.0–10.5)
WBC: 6 10*3/uL (ref 4.0–10.5)
WBC: 6.5 10*3/uL (ref 4.0–10.5)
WBC: 8.4 10*3/uL (ref 4.0–10.5)

## 2011-02-18 LAB — CARDIAC PANEL(CRET KIN+CKTOT+MB+TROPI)
Relative Index: 2.5 (ref 0.0–2.5)
Relative Index: 3.5 — ABNORMAL HIGH (ref 0.0–2.5)
Relative Index: INVALID (ref 0.0–2.5)
Total CK: 74 U/L (ref 7–232)
Troponin I: 0.02 ng/mL (ref 0.00–0.06)
Troponin I: 0.11 ng/mL — ABNORMAL HIGH (ref 0.00–0.06)

## 2011-02-18 LAB — CK TOTAL AND CKMB (NOT AT ARMC)
CK, MB: 3.6 ng/mL (ref 0.3–4.0)
Total CK: 148 U/L (ref 7–232)

## 2011-02-18 LAB — BASIC METABOLIC PANEL
BUN: 10 mg/dL (ref 6–23)
BUN: 10 mg/dL (ref 6–23)
CO2: 25 mEq/L (ref 19–32)
Calcium: 8.9 mg/dL (ref 8.4–10.5)
Calcium: 9.1 mg/dL (ref 8.4–10.5)
Chloride: 105 mEq/L (ref 96–112)
Creatinine, Ser: 1.19 mg/dL (ref 0.4–1.5)
GFR calc Af Amer: >60 mL/min (ref >60)
GFR calc Af Amer: >60 mL/min (ref >60)
GFR calc non Af Amer: 57 mL/min — ABNORMAL LOW (ref >60)
GFR calc non Af Amer: 59 mL/min — ABNORMAL LOW (ref >60)
GFR calc non Af Amer: >60 mL/min (ref >60)
Glucose, Bld: 112 mg/dL — ABNORMAL HIGH (ref 70–99)
Glucose, Bld: 131 mg/dL — ABNORMAL HIGH (ref 70–99)
Glucose, Bld: 147 mg/dL — ABNORMAL HIGH (ref 70–99)
Potassium: 4.2 mEq/L (ref 3.5–5.1)
Potassium: 4.3 mEq/L (ref 3.5–5.1)
Potassium: 4.5 mEq/L (ref 3.5–5.1)
Potassium: 4.8 mEq/L (ref 3.5–5.1)
Sodium: 137 mEq/L (ref 135–145)
Sodium: 139 mEq/L (ref 135–145)
Sodium: 140 mEq/L (ref 135–145)
Sodium: 140 mEq/L (ref 135–145)

## 2011-02-18 LAB — CROSSMATCH
ABO/RH(D): A POS
Antibody Screen: NEGATIVE

## 2011-02-18 LAB — IRON: Iron: 21 ug/dL — ABNORMAL LOW (ref 42–135)

## 2011-02-18 LAB — HEPARIN LEVEL (UNFRACTIONATED)
Heparin Unfractionated: 0.3 IU/mL (ref 0.30–0.70)
Heparin Unfractionated: 0.35 IU/mL (ref 0.30–0.70)
Heparin Unfractionated: 0.37 IU/mL (ref 0.30–0.70)
Heparin Unfractionated: 0.43 IU/mL (ref 0.30–0.70)
Heparin Unfractionated: 0.51 IU/mL (ref 0.30–0.70)

## 2011-02-18 LAB — COMPREHENSIVE METABOLIC PANEL
ALT: 13 U/L (ref 0–53)
AST: 22 U/L (ref 0–37)
Albumin: 3.4 g/dL — ABNORMAL LOW (ref 3.5–5.2)
Calcium: 8.7 mg/dL (ref 8.4–10.5)
GFR calc Af Amer: >60 mL/min (ref >60)
Sodium: 140 mEq/L (ref 135–145)
Total Protein: 6.4 g/dL (ref 6.0–8.3)

## 2011-02-18 LAB — FERRITIN
Ferritin: 20 ng/mL — ABNORMAL LOW (ref 22–322)
Ferritin: 28 ng/mL (ref 22–322)

## 2011-02-18 LAB — GLUCOSE, CAPILLARY: Glucose-Capillary: 116 mg/dL — ABNORMAL HIGH (ref 70–99)

## 2011-02-18 LAB — IRON AND TIBC
Iron: 15 ug/dL — ABNORMAL LOW (ref 42–135)
TIBC: 344 ug/dL (ref 215–435)
UIBC: 355 ug/dL

## 2011-02-18 LAB — RETICULOCYTES
RBC.: 3.68 MIL/uL — ABNORMAL LOW (ref 4.22–5.81)
Retic Count, Absolute: 35.5 10*3/uL (ref 19.0–186.0)
Retic Count, Absolute: 40.5 10*3/uL (ref 19.0–186.0)

## 2011-02-18 LAB — MRSA PCR SCREENING: MRSA by PCR: NEGATIVE

## 2011-02-18 LAB — LIPID PANEL
HDL: 33 mg/dL — ABNORMAL LOW (ref >39)
Total CHOL/HDL Ratio: 5.5 RATIO

## 2011-02-18 LAB — PROTIME-INR
INR: 1.12 (ref 0.00–1.49)
Prothrombin Time: 14.3 seconds (ref 11.6–15.2)

## 2011-02-18 LAB — FOLATE: Folate: 15.9 ng/mL

## 2011-02-18 LAB — HEMOCCULT GUIAC POC 1CARD (OFFICE): Fecal Occult Bld: NEGATIVE

## 2011-03-12 LAB — COMPREHENSIVE METABOLIC PANEL
ALT: 11 U/L (ref 0–53)
Calcium: 9.4 mg/dL (ref 8.4–10.5)
Creatinine, Ser: 1.02 mg/dL (ref 0.4–1.5)
GFR calc Af Amer: >60 mL/min (ref >60)
Glucose, Bld: 98 mg/dL (ref 70–99)
Sodium: 140 mEq/L (ref 135–145)
Total Protein: 6.3 g/dL (ref 6.0–8.3)

## 2011-03-12 LAB — BASIC METABOLIC PANEL
BUN: 8 mg/dL (ref 6–23)
CO2: 26 mEq/L (ref 19–32)
Chloride: 103 mEq/L (ref 96–112)
Creatinine, Ser: 0.9 mg/dL (ref 0.4–1.5)
Glucose, Bld: 145 mg/dL — ABNORMAL HIGH (ref 70–99)

## 2011-03-12 LAB — URINALYSIS, ROUTINE W REFLEX MICROSCOPIC
Glucose, UA: NEGATIVE mg/dL
Nitrite: NEGATIVE
Protein, ur: NEGATIVE mg/dL
Urobilinogen, UA: 1 mg/dL (ref 0.0–1.0)

## 2011-03-12 LAB — PROTIME-INR
INR: 1.1 (ref 0.00–1.49)
Prothrombin Time: 14 seconds (ref 11.6–15.2)

## 2011-03-12 LAB — CBC
HCT: 29.8 % — ABNORMAL LOW (ref 39.0–52.0)
Hemoglobin: 12.2 g/dL — ABNORMAL LOW (ref 13.0–17.0)
MCHC: 33.7 g/dL (ref 30.0–36.0)
MCHC: 34.1 g/dL (ref 30.0–36.0)
MCV: 88.3 fL (ref 78.0–100.0)
Platelets: 196 10*3/uL (ref 150–400)
RDW: 13.9 % (ref 11.5–15.5)

## 2011-03-12 LAB — TYPE AND SCREEN: ABO/RH(D): A POS

## 2011-03-12 LAB — APTT: aPTT: 33 seconds (ref 24–37)

## 2011-03-13 LAB — APTT: aPTT: 33 seconds (ref 24–37)

## 2011-03-13 LAB — CBC
MCHC: 33.8 g/dL (ref 30.0–36.0)
Platelets: 237 10*3/uL (ref 150–400)
RBC: 3.93 MIL/uL — ABNORMAL LOW (ref 4.22–5.81)
WBC: 7.8 10*3/uL (ref 4.0–10.5)

## 2011-03-13 LAB — POCT I-STAT, CHEM 8
Calcium, Ion: 1.22 mmol/L (ref 1.12–1.32)
HCT: 33 % — ABNORMAL LOW (ref 39.0–52.0)
Hemoglobin: 11.2 g/dL — ABNORMAL LOW (ref 13.0–17.0)
Hemoglobin: 12.9 g/dL — ABNORMAL LOW (ref 13.0–17.0)
Potassium: 4.1 mEq/L (ref 3.5–5.1)
Sodium: 140 mEq/L (ref 135–145)
Sodium: 141 mEq/L (ref 135–145)
TCO2: 27 mmol/L (ref 0–100)

## 2011-03-13 LAB — DIFFERENTIAL
Basophils Relative: 1 % (ref 0–1)
Eosinophils Absolute: 0.3 10*3/uL (ref 0.0–0.7)
Lymphs Abs: 1.3 10*3/uL (ref 0.7–4.0)
Monocytes Relative: 8 % (ref 3–12)
Neutro Abs: 5.5 10*3/uL (ref 1.7–7.7)
Neutrophils Relative %: 71 % (ref 43–77)

## 2011-03-13 LAB — POCT CARDIAC MARKERS
CKMB, poc: <1 ng/mL — ABNORMAL LOW (ref 1.0–8.0)
CKMB, poc: <1 ng/mL — ABNORMAL LOW (ref 1.0–8.0)
CKMB, poc: <1 ng/mL — ABNORMAL LOW (ref 1.0–8.0)
Myoglobin, poc: 62 ng/mL (ref 12–200)
Myoglobin, poc: 78.3 ng/mL (ref 12–200)

## 2011-03-13 LAB — PROTIME-INR
INR: 1 (ref 0.00–1.49)
Prothrombin Time: 13.9 seconds (ref 11.6–15.2)

## 2011-03-17 LAB — CBC
HCT: 32.3 % — ABNORMAL LOW (ref 39.0–52.0)
MCHC: 34.3 g/dL (ref 30.0–36.0)
Platelets: 172 10*3/uL (ref 150–400)
RDW: 13.5 % (ref 11.5–15.5)

## 2011-03-17 LAB — CK TOTAL AND CKMB (NOT AT ARMC)
Relative Index: INVALID (ref 0.0–2.5)
Total CK: 75 U/L (ref 7–232)

## 2011-03-17 LAB — BASIC METABOLIC PANEL
BUN: 8 mg/dL (ref 6–23)
CO2: 26 mEq/L (ref 19–32)
GFR calc non Af Amer: >60 mL/min (ref >60)
Glucose, Bld: 105 mg/dL — ABNORMAL HIGH (ref 70–99)
Potassium: 3.7 mEq/L (ref 3.5–5.1)

## 2011-04-15 NOTE — H&P (Signed)
HISTORY AND PHYSICAL EXAMINATION   February 20, 2009   Re:  [name], [name]                 DOB:  1936-04-23   The patient is a pleasant 75 year old gentleman who I had seen in  consultation in September of 2009 with a 4.8 cm infrarenal abdominal  aortic aneurysm.  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 Hart Rochester back in 1982 using saphenous vein from the  right thigh.  He had been somewhat confused about what was done back but  I was able to obtain his previous operative reports.  He also had an  exploration of the left popliteal artery but this was found to not be  significantly aneurysmal.  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  The abdominal aortic aneurysm according to the ultrasound had enlarged  to 5.65 cm.  Of note, he had had no significant abdominal or back pain  and I thought consideration should be given to elective repair of the  aneurysm so I arranged for angiogram and CT scan to complete his workup.  The CT scan of his abdomen and pelvis shows that the aneurysm was  actually only 5 cm in maximum diameter and has thus not enlarged  significantly since September.  He has a fairly short neck with some  laminated thrombus within the neck.  Arteriogram confirms a large  aneurysm within the vein graft repair of his previous right popliteal  artery aneurysm.  He has no evidence of aneurysmal disease in the left  popliteal artery.  He also had significant tibial occlusive disease  bilaterally and on the right side had single vessel runoff via the  peroneal artery.  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   PAST MEDICAL HISTORY:  Significant for a recent PTCA by Dr. Sharyn Lull  and  he had two stents placed and has been on Plavix.  I have discussed the  Plavix with Dr. Sharyn Lull and he feels that the patient should remain on  Plavix perioperatively.  His past medical history is otherwise  significant for hypertension, hyperlipidemia, history of peptic ulcer  disease status post a gastrectomy in 1978, status post cholecystectomy  in 1979, history of pernicious anemia and he is transfused  intermittently.  He also apparently has a history of polymyalgia  rheumatica, history of status post appendectomy.   FAMILY HISTORY:  His father died at age 70 with a heart attack.  His  mother had several strokes.  He has had two brothers who have had  coronary artery disease.  He also has had a sister who has had coronary  stents placed.  He has a strong family history of cardiovascular  disease.   SOCIAL HISTORY:  He is married, has two children.  He quit tobacco in  1990.   ALLERGIES:  Morphine and codeine.  He apparently also has an intolerance  to statins.   MEDICATIONS:  1. Protonix 40 mg p.o. daily.  2. Plavix 75 mg p.o. daily.  3. Citalopram 20 mg p.o. daily.  4. Bystolic 5 mg half tab p.o. daily.  5. Aspirin 81 mg p.o. daily.  6. Tavist 1.34 mcg p.o.  daily.  7. Spiriva p.r.n.  8. Symbicort 80 mEq p.r.n.  9. Temazepam 30 mg p.r.n.  10.Nitro tab 0.4 mg sublingual p.r.n.   REVIEW OF SYSTEMS:  GENERAL:  He has had no recent weight loss, weight  gain or problems with his appetite.  CARDIAC:  He has had no chest pain, chest pressure, palpitations or  arrhythmias.  He did have a pacemaker placed in December of 2007.  PULMONARY:  He has occasional bronchitis.  He is not on home O2.  He has  had no recent productive cough, asthma or wheezing.  GI:  He does have a history of reflux.  He has had no recent change in  his bowel habits and has no history of recent hematochezia or melena.  GU:  He has some urinary frequency.  VASCULAR:  He has had no history of stroke,  TIAs, expressive or  receptive aphasia or amaurosis fugax.  He has had no history of DVT or  phlebitis.  NEURO:  He has had occasional headaches.  ORTHO:  He has had some arthritis and occasional muscle pain.  PSYCHIATRIC:  He has had occasional depression.  ENT:  He has had some changes in his hearing recently.  HEMATOLOGIC:  He has a history of anemia and occasionally receives IV  iron.   With respect to his abdominal aortic aneurysm he understands we would  normally not consider elective repair of the aneurysm unless it reached  5.5 cm in maximum diameter in a normal risk patient.  I simply recommend  a followup CT scan in 6 months so that we can continue to follow this.  Unfortunately if the aneurysm does enlarge enough to consider elective  repair he does not appear to be an ideal candidate for endovascular  repair as he has a fairly short neck with some laminated thrombus.   With respect to his popliteal artery aneurysm I think he is at  significant risk for acute arterial occlusion with thrombosis and limb  threatening ischemia.  The aneurysm is also quite large and I believe it  is also possible that it could enlarge and even rupture.  He is also  obviously at risk for embolic disease and has single vessel runoff via  the peroneal artery.  For this reason I have recommended redo repair of  the aneurysm on the right and we will have to take vein from the left  thigh.  We have discussed the indications for repair of the right  popliteal artery aneurysm and the potential complications including but  not limited to bleeding, wound healing problems, graft thrombosis and  limb threatening ischemia.  Of note, I have discussed the Plavix with  Dr. Sharyn Lull and he will need to stay on his Plavix around the time of  procedure which increases his risk slightly of having perioperative  bleeding problems.  He also is going to arrange to see Dr. Arline Asp, his  hematologist, prior to  proceeding with his surgery which is tentatively  scheduled for April 14.   Finally, he was noted to have carotid bruits and he did have a carotid  duplex scan done in our office today which showed a 40-59% right carotid  stenosis with a less than 39% left carotid stenosis.  He will need a  followup carotid duplex scan in 6 months.  We will continue to follow  this.   Di Kindle. Edilia Bo, M.D.  Electronically Signed   CSD/MEDQ  D:  02/20/2009  T:  02/21/2009  Job:  [id number]   cc:   [name]. Sharyn Lull, M.D.  Gaspar Garbe, M.D.  Samul Dada, M.D.

## 2011-04-15 NOTE — Procedures (Signed)
DUPLEX ULTRASOUND OF ABDOMINAL AORTA   INDICATION:  Abdominal aortic aneurysm.   HISTORY:  Diabetes:  No.  Cardiac:  No.  Hypertension:  No.  Smoking:  Quit.  Connective Tissue Disorder:  Family History:  Yes.  Previous Surgery:  No.   DUPLEX EXAM:         AP (cm)                   TRANSVERSE (cm)  Proximal             2.92 cm                   3.15 cm  Mid                  4.62 cm                   5.65 cm  Distal               3.39 cm                   4.42 cm  Right Iliac          1.0 cm                    1.31 cm  Left Iliac           1.18 cm                   1.24 cm   PREVIOUS:  Date:  AP:  4.8  TRANSVERSE:   IMPRESSION:  There is significant increase in abdominal aortic aneurysm  since the last CT.   ___________________________________________  Di Kindle. Edilia Bo, M.D.   AC/MEDQ  D:  01/30/2009  T:  01/30/2009  Job:  [id number]

## 2011-04-15 NOTE — Assessment & Plan Note (Signed)
OFFICE VISIT   STALIN, GRUENBERG  DOB:  February 12, 1936                                       04/03/2009  CHART#:[id number]   I saw the patient in the office today for continued followup of his  right leg bypass.  He had a redo repair of a right popliteal artery  aneurysm.  He had developed an aneurysm in his vein graft from an old  popliteal artery aneurysm repair.  He comes in for a routine visit.  His  only complaint has been some swelling in the right leg and a small  amount of drainage from the left groin.   PHYSICAL EXAMINATION:  On examination blood pressure is 128/71, heart  rate is 64.  We took some vein from the left groin and thigh and there  is one small area that is open in the groin incision but I think this  will heal with simply bacitracin and a Band-Aid.  He has an eschar over  the distal incision on the right leg but no significant drainage or  fluctuance currently.  He does have some mild erythema so I put him on  Keflex for 10 days.  He has a palpable popliteal pulse and his Doppler  study today shows an ABI of 92% on the right with biphasic Doppler  signals in the dorsalis pedis and posterior tibial positions on the  right.   I will see him back in 3 weeks to check on his wound.  He knows to call  sooner if he has problems.  Of note, he will be due to have his  abdominal aortic aneurysm checked in September.   Di Kindle. Edilia Bo, M.D.  Electronically Signed   CSD/MEDQ  D:  04/03/2009  T:  04/04/2009  Job:  [id number]

## 2011-04-15 NOTE — Op Note (Signed)
[name], [name]                 ACCOUNT NO.:  [id number]   MEDICAL RECORD NO.:  [id number]          PATIENT TYPE:  AMB   LOCATION:  ENDO                         FACILITY:  [facility]   PHYSICIAN:  Anselmo Rod, M.D.  DATE OF BIRTH:  Jul 15, 1936   DATE OF PROCEDURE:  08/23/2007  DATE OF DISCHARGE:                               OPERATIVE REPORT   PROCEDURE PERFORMED:  Diagnostic esophagogastroduodenoscopy.   ENDOSCOPIST:  Anselmo Rod, M.D.   INSTRUMENT USED:  Pentax video panendoscope.   INDICATIONS FOR PROCEDURE:  A 75 year old white male with a history of  reflux, status post gastrectomy for ulcers, iron-deficiency anemia, and  nausea with chest pain, undergoing an EGD to rule out esophagitis or  ulcer disease.   PREPROCEDURE PREPARATION:  Informed consent was procured from the  patient.  The patient fasted for 8 hours prior to the procedure.  Risks  and benefits of the procedure were discussed with the patient in great  detail.   PREPROCEDURE PHYSICAL EXAMINATION:  VITAL SIGNS:  The patient had stable  vital signs.  NECK:  Supple.  CHEST:  Decreased breath sounds at the bases.  CARDIOVASCULAR:  S1, S2 regular.  There is a defibrillator present in  the left chest, subcutaneously.  ABDOMEN:  Soft, with normal bowel sounds.   DESCRIPTION OF THE PROCEDURE:  The patient was placed in the left  lateral decubitus position and sedated with 60 mcg of Fentanyl and 6 mg  of Versed given intravenously in slow incremental doses.  Once the  patient was adequately sedated and maintained on low-flow oxygen and  continuous cardiac monitoring, the Pentax video panendoscope was  advanced through the mouthpiece over the tongue and into the esophagus  under direct vision. The vocal cords appeared healthy. The entire  esophagus appeared normal, with a healthy-appearing Z-line.  The scope  was then advanced into the stomach. The patient has evidence of a  partial gastrectomy.  The  anastomosis appeared healthy.  The proximal  small bowel was normal.  No ulcers, erosions, masses, or polyps were  identified.  Retroflexion in the high cardia revealed no abnormalities.   IMPRESSION:  1. Normal-appearing esophagus.  2. Patient is status post partial gastrectomy. No ulcers or masses      present.  3. Healthy anastomosis from previous partial gastrectomy.  4. Normal proximal small bowel.   RECOMMENDATIONS:  1. Continue PPI's.  2. Outpatient followup in the next 2 weeks for further      recommendations.  3. Avoid OTC nonsteroidals.  4. Further recommendations to be made in followup.  5.Patient      complains of ongoing chest pain and therefore folow up with Dr.      Elsie Lincoln is advised, ASAP.      Anselmo Rod, M.D.  Electronically Signed     JNM/MEDQ  D:  08/23/2007  T:  08/23/2007  Job:  [id number]   cc:   Gaspar Garbe, M.D.  Fax: [phone]   Duke Salvia, MD, [facility]  [address]  [address]  [place]  [place] [address]

## 2011-04-15 NOTE — Procedures (Signed)
BYPASS GRAFT EVALUATION   INDICATION:  Followup right above knee to below knee popliteal artery  bypass graft.   HISTORY:  Diabetes:  No.  Cardiac:  MI.  Hypertension:  No.  Smoking:  Previous.  Previous Surgery:  Right above knee to below knee popliteal artery  bypass graft 03/14/2009 by Dr. Edilia Bo.   SINGLE LEVEL ARTERIAL EXAM                               RIGHT              LEFT  Brachial:                    134                127  Anterior tibial:             105                138  Posterior tibial:            142                139  Peroneal:  Ankle/brachial index:        1.06               1.04   PREVIOUS ABI:  Date:  04/03/2009  RIGHT:  0.92  LEFT:  1.02   LOWER EXTREMITY BYPASS GRAFT DUPLEX EXAM:   DUPLEX:  Patent right above knee to below knee popliteal artery bypass  graft with no evidence of stenosis.  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   IMPRESSION:  1. Patent right above knee to below knee popliteal artery bypass      graft.  2. Residual aneurysm sac was visualized which appears to lie adjacent      to the bypass graft.  3. Stable ankle brachial indices bilaterally.   ___________________________________________  Di Kindle. Edilia Bo, M.D.   AS/MEDQ  D:  02/28/2010  T:  02/28/2010  Job:  [id number]

## 2011-04-15 NOTE — Letter (Signed)
September 06, 2007    Gaspar Garbe, M.D.  [address]  [place], [place] [address]   RE:  RICAHRD, SCHWAGER  MRN:  [id number]  /  DOB:  10/03/1936   PROBLEM LIST:  1. Shortness of breath due to #2 predominantly. Some component of #3      and deconditioning  2. CAD with decreased ef Sept 2008 following stent occlusion. Reopened      stent 08/2007 but worsening dyspnea following transient improvement  3. Chronic obstructive pulmonary disease.  Started on Spiriva and      inhaled steroids on September 06, 2007.  4. Isolated low DLCO due to COPD on CT chest.  5. Chest pain on exertion probably related to coronary artery disease.  6. Amiodarone for atrial fibrillation with baseline DLCO of 69% and      negative chest x-ray, on expectant follow-up.  7. Six minute walk test of 168 meters without desaturation but with      tachycardia and high blood pressure response on August 31, 2007.   Dear. Dr. Wylene Simmer:   I have seen Mr. Gains on follow-up.  I last saw him on August 04, 2007, at the time for worsening dyspnea following myocardial infarction  in January 2008.  Echocardiogram on 08/10/2007 Amongst showed decreased  ejection fraction to 45% and severe hypokinesis of the posterior wall.  In the interim, he states he underwent endoscopy and then as referred to  cardiology.  I do not know exactly the details but he underwent cardiac  catheterization and it was noted that the stent that  placed back in  January 2008, had occluded and he had intervention to reopen the stent.  After that, he states that his shortness of breath improved for about  three or four days but worsened again to pre-stent intervention  baseline. He is currently dyspneic for just about anything.  He still  has his chest pain that comes on with exertion but also at rest.   He also complains of some insomnia and jerky leg movements.   He denies any orthopnea, paroxysmal nocturnal dyspnea, defibrillator  discharge or  any edema.   PAST MEDICAL HISTORY:  Changes as noted in the history of present  illness, otherwise no change from August 04, 2007.   PAST SURGICAL HISTORY:  No change since August 04, 2007.   ALLERGIES:  No change since August 04, 2007.   CURRENT MEDICATIONS:  No change since August 04, 2007.   SOCIAL HISTORY:  No change since August 04, 2007.   FAMILY HISTORY:  No change since August 04, 2007.   REVIEW OF SYSTEMS:  No change since August 04, 2007.   PHYSICAL EXAMINATION:  No change since August 04, 2007.  VITAL SIGNS:  Weight 174 pounds, temperature 98.2, blood pressure  110/62, pulse 65, 97% on room air.  GENERAL APPEARANCE:  Seated comfortably in the exam room.  CENTRAL NERVOUS SYSTEM:  Alert and oriented x3.  Speech and ambulation  are normal.  NECK:  No neck nodes.  Supple neck.  No elevated JVP.  CHEST:  Median sternotomy scar present.  RESPIRATORY:  Air entry equal, normal breath sounds.  CARDIOVASCULAR:  Normal heart sounds.  ABDOMEN:  Midline laparotomy scar, normal bowel sounds.  EXTREMITIES:  No clubbing, cyanosis, or pedal edema.  BACK:  No kyphosis, no scoliosis.  SKIN:  Intact.   LABORATORY DATA:  Echocardiogram  on August 09, 2007, as noted in the  history of present illness.  This changed from prior echo and showed  diminished EF.   Lung function studies from August 31, 2007, FEV1 2.17 liters and 80%  predicted, FVC 3.8 liters and 96% predicted, FV1:FVC ratio diminished at  about 57, there was 50% bronchodilator response in both FEV1 and FVC.  His total lung capacity was 5.72 liters, 98% predicted.  Unadjusted DLCO  was 19.9 mL/mmHg/minute and this was reduced at 69%.   Six minute walk test:  Blood pressure at rest 124/74, heart rate 65.  Dyspnea scale 2/10, fatigue scale 3/10, pulse oximeter 99% on room air.  He walked 168 meters in six minutes.  At the end of the test, blood  pressure rose to 142/84, heart rate rose to 90,  dyspnea scale rose to 3,  fatigue scale remained at 3, pulse oximeter remained at 99% on room air.  He complained of slight chest pain and dizziness but it subsided at  completion of test.   ASSESSMENT/PLAN:  1. Dyspnea.  History of worsening dyspnea after the myocardial      infarction Jan 2008 with drop in ejection fraction correlating with      stent occlusion in SVG. Hx of transient improvment in dyspnea after      stent patency restoration. Tachycardic response on 6 MWD. All this,      suggest cardiac dysfunction as the #1 cause of dyspnea.  In      addition, some deconditioning and chronic obstructive pulmonary      disease can be contributing to his dyspnea. PLAN: Refer back to      cardiology. Spoke to Dr. Elsie Lincoln on 09/08/2007 at around 9-10 am at      [facility] [address]. He remembered Mr. Gignac and will see him earlier in      clinic. Also, consider pulmonary rehab but am not sure it is safe      for him at this point.   1. Chest pains.  I think there is an atypical component and an angina      component to this.  This is currently stable.  Defer this to      cardiology for follow-up.   1. Isolated low DLCO.  PFTs show isolated low DLCO. Prior CT scan has      shown a bullous COPD. Start Spiriva and inhaled steroids for this.      This could also help dyspnea.   1. Amiodarone.  Currently on amiodarone for atrial fibrillation.      Baseline DLCO is 69% with a negative chest x-ray.  There is no      evidence of amiodarone toxicity but this needs to be monitored over      time.   1. Health maintenance.  I have given him a flu shot.   1. Restless legs - will refer to sleep specialty.   I will see him again in six months with repeat pulmonary function tests  to monitor his shortness of breath and isolated DLCO.    Sincerely,      Kalman Shan, MD  Electronically Signed    MR/MedQ  DD: 09/06/2007  DT: 09/07/2007  Job #: [id number]   CC:    Madaline Savage, M.D.

## 2011-04-15 NOTE — Assessment & Plan Note (Signed)
OFFICE VISIT   [name], [name]  DOB:  09/05/1936                                       02/28/2010  CHART#:[id number]   I saw the patient in the office today for followup of his aneurysm and  his redo popliteal artery aneurysm repair.  I had originally seen him in  consultation in September of 2009 with a 4.8 cm infrarenal abdominal  aortic aneurysm.  He had a popliteal artery aneurysm repair on the left  by Dr. Hart Rochester in 1982.  He also had previous popliteal artery aneurysm  repair on the right prior to this although we do not have these records.  He underwent redo repair of his right popliteal artery aneurysm on  03/14/2010 (sic) as he had developed an aneurysm within his bypass  graft.  He comes in for routine followup studies.  He denies any history  of back pain.  He has had some vague intermittent lower abdominal pain  which comes and goes but is not very severe.  There are no aggravating  or alleviating factors.  The pain is mostly in his lower abdomen and  usually only lasts very briefly.  He has had no constant pain in his  abdomen.   PAST MEDICAL HISTORY:  Significant for hypercholesterolemia which has  been stable on his current medications.  He also has emphysema and he  has had no recent pulmonary problems.  He has had some recent chest  pain.  He does have a history of previous myocardial infarction 2 years  ago.  He is being worked up by Dr. Sharyn Lull.  He is scheduled for a  stress test and echo I believe.  His past medical history is otherwise  fairly unremarkable.  He denies any history of diabetes, hypertension or  history of congestive heart failure.   FAMILY HISTORY:  There is no history of premature cardiovascular  disease.   SOCIAL HISTORY:  He is married.  He has one child.  He quit tobacco in  1990.   REVIEW OF SYSTEMS:  CARDIOVASCULAR:  He has had some chest pain over the  last several months.  He admits to some orthopnea and  dyspnea on  exertion.  He has had no palpitations.  He has had no claudication, rest  pain or nonhealing ulcers.  He has had no history of stroke, TIAs or  amaurosis fugax.  He has had no history of DVT or phlebitis.  PULMONARY:  He has occasional wheezing.  He has had no recent productive  cough or bronchitis.  GI:  He does have a history of reflux.   PHYSICAL EXAMINATION:  General:  This is a pleasant 75 year old  gentleman who appears his stated age.  Vital signs:  Blood pressure is  143/81, heart rate is 66, temperature 98.  HEENT:  Unremarkable.  Lungs:  Are clear bilaterally to auscultation without rales, rhonchi or  wheezing.  Cardiovascular:  I do not detect any carotid bruits.  He has  a regular rate and rhythm.  He has no significant peripheral edema.  He  has palpable femoral pulses bilaterally and warm, well-perfused feet  without ischemic ulcers.  Neurologic:  Exam is nonfocal.   I independently interpreted his Doppler study today which shows that his  right popliteal artery bypass graft is patent.  The aneurysm behind the  knee is thrombosed.   The maximum size of his infrarenal aorta is 4.87 cm and has not changed  compared to his CT scan a year ago when the aneurysm measured 5.0 cm.   He understands we would not consider elective repair of his aneurysm  unless it enlarged to 5.5 cm.  I have recommended a followup ultrasound  in 6 months and I will see him back at that time.  We will also continue  to follow his bypass grafts in both legs.  He knows to call sooner if he  has problems.     Di Kindle. Edilia Bo, M.D.  Electronically Signed   CSD/MEDQ  D:  02/28/2010  T:  03/01/2010  Job:  [id number]

## 2011-04-15 NOTE — Letter (Signed)
October 10, 2008    [name]. Sharyn Lull, MD  [address]  [place], [place] [address]   RE:  [name], [name]  MRN:  [id number]  /  DOB:  [birth year]   Dear Dr. Sharyn Lull:   It is a pleasure to be talking to you about [name].  I have followed  him in conjunction with Dr. Elsie Lincoln for some years because of the setting  of his nonsustained ventricular tachycardia, depressed left ventricular  function, and ischemic cardiomyopathy with prior bypass.  He had  inducible sustained VT, hence he received his defibrillator.  He had  loss of nonsustained ventricular tachycardia.   He is doing quite well and certainly has enjoyed your care.   His medications have been changed since I last saw him including the  discontinuation of his amiodarone and his ACE inhibitor.  He is  currently on lisinopril with better control of his blood pressure.  He  is also on Plavix and aspirin.   PHYSICAL EXAMINATION:  VITAL SIGNS:  Today, his blood pressure was  124/77, his pulse was 70.  LUNGS:  Clear.  HEART:  Sounds were regular.  NECK:  Veins were flat.  ABDOMEN:  Soft.  EXTREMITIES:  Without edema.   Interrogation of Medtronic InSync ICD demonstrates a P-wave of 4, the  impedance of 640, threshold of 1 volt at 0.4 in both chambers.  The R-  wave of 4.8 with the impedance of 448.  The battery voltage was 3.09 and  there has been a new flurry of nonsustained ventricular tachycardia over  the last months.  He also has a 6949 lead in place.   IMPRESSION:  1. Ischemic heart disease.      a.     Status post coronary artery bypass graft.      b.     Modest depression of left ventricular function.  Last       ejection fraction that I have is 45%.  2. Nonsustained ventricular tachycardia with inducible sustained      ventricular tachycardia.  3. Status post implantable cardioverter-defibrillator.  4. Previously on amiodarone for nonsustained ventricular tachycardia,      now with  recurrence of ventricular tachycardia in the setting of      his amiodarone been discontinued.  5. A 6949 lead - stable.  6. Abdominal aortic aneurysm.   [name], Dr. Sharyn Lull is having no sustained arrhythmias.  I am  concerned, however that he has had now a large increase in his  nonsustained ventricular tachycardia with 16 episodes in the last couple  of weeks after he has had none for many many months.  Because of that I  have taken a liberty of asking him to back on his amiodarone of 100 mg a  day.   I have also asked him to discuss with you as what you think lisinopril  would be helpful given his cardiomyopathy as well as given his AAA.   We will see him again in 1 year's time.  We will follow him remotely in  the interim.  He has also been alerted as to the importance of a 6949  lead follow up.    Sincerely,      Duke Salvia, MD, [facility]  Electronically Signed    SCK/MedQ  DD: 10/10/2008  DT: 10/11/2008  Job #: [id number]

## 2011-04-15 NOTE — Discharge Summary (Signed)
NAMELUCIA, MCCREADIE NO.:  [id number]   MEDICAL RECORD NO.:  [id number]          PATIENT TYPE:  INP   LOCATION:  2038                         FACILITY:  [facility]   PHYSICIAN:  Di Kindle. Edilia Bo, M.D.DATE OF BIRTH:  1936-07-31   DATE OF ADMISSION:  03/14/2009  DATE OF DISCHARGE:  03/17/2009                               DISCHARGE SUMMARY   ADDENDUM   In addition, the patient is on Tavist 1.34 mg, which he takes p.o.  q.a.m.  This can be added to his discharge medication list.      Jerold Coombe, P.A.      Di Kindle. Edilia Bo, M.D.  Electronically Signed    AWZ/MEDQ  D:  03/17/2009  T:  03/18/2009  Job:  [id number]

## 2011-04-15 NOTE — Procedures (Signed)
VASCULAR LAB EXAM   INDICATION:  Preoperative vein mapping.   HISTORY:  Diabetes:  No.  Cardiac:  No.  Hypertension:  No.   EXAM:  Bilateral lower extremity vein mapping.   IMPRESSION:  1. The right greater saphenous was not visualized.  2. The right greater saphenous vein was most likely removed during      previous surgeries.  3. The right lesser saphenous vein is compressible with diameter      measurements ranging from 0.26-0.32 cm.  4. The left greater saphenous vein is compressible with diameter      measurements ranging from 0.19-0.48 cm from knee to groin.  This      vein is most likely the medial branch of the greater saphenous      vein, since there is evidence that the main greater saphenous vein      has been removed from previous surgery.  5. The left lesser saphenous vein is compressible with diameter      measurements ranging from 0.28-0.3 cm.   ___________________________________________  Di Kindle. Edilia Bo, M.D.   CH/MEDQ  D:  02/20/2009  T:  02/20/2009  Job:  [id number]

## 2011-04-15 NOTE — Procedures (Signed)
DUPLEX ULTRASOUND OF ABDOMINAL AORTA   INDICATION:  Followup abdominal aortic aneurysm.   HISTORY:  Diabetes:  No.  Cardiac:  MI.  Hypertension:  No.  Smoking:  Previous.  Connective Tissue Disorder:  Family History:  No.  Previous Surgery:  Right above knee to below knee popliteal artery  bypass graft 03/14/2009 by Dr. Edilia Bo.   DUPLEX EXAM:         AP (cm)                   TRANSVERSE (cm)  Proximal             2.02 cm                   1.94 cm  Mid                  3.07 cm                   3.08 cm  Distal               4.62 cm                   4.87 cm  Right Iliac          1.24 cm                   1.24 cm  Left Iliac           1.24 cm                   1.11 cm   PREVIOUS:  Date:  02/07/2009 (CT)  AP:  4.6  TRANSVERSE:  5.0   IMPRESSION:  Stable abdominal aortic aneurysm when compared to previous  CT.   ___________________________________________  Di Kindle. Edilia Bo, M.D.   AS/MEDQ  D:  02/28/2010  T:  02/28/2010  Job:  [id number]

## 2011-04-15 NOTE — Letter (Signed)
Apr 29, 2007    [name], M.D.  [phone]  [place], [place] [address]   RE:  [name], [name]  MRN:  [id number]  /  DOB:  [birth year]   Dear [name],   [name] comes in for device followup.  He had inducible ventricular  tachycardia and is status post ICD implantation.  This occurs in the  context of ischemic heart disease, prior revascularization.   His major complaint is shortness of breath.  This is primarily  exertional.   He also was complaining of dizziness a couple of months ago, but this  has largely resolved.   His medications currently include amiodarone 200 b.i.d.  He is not on  aspirin.  He is on an ACE inhibitor, lisinopril.  He is on Plavix 75.   PHYSICAL EXAMINATION:  VITAL SIGNS:  His blood pressure today was  142/80.  His pulse was 80.  LUNGS:  Clear.  CARDIAC:  Heart sounds were regular.  EXTREMITIES:  No edema.   Interrogation of his Medtronic EnTrust pulse generator demonstrates a P  wave of 4.2 with an impedance of 544, threshold of 0.5 at 0.4.  An R  wave of 5.1 with an impedance of 440, threshold of 0.5 at 0.4.  Battery  voltage is 3.16.  There is one nonsustained ventricular episode, one  atrial episode.  The device is reprogrammed to maximize longevity.   We also walked him a couple of times and found that if we changed his  threshold from medium low to medium high and his rate responds from 7 to  8 and increases to max sensor rate to 130, that his exercise tolerance  was improved.   IMPRESSION:  1. Dyspnea on exertion.  2. Ischemic heart disease with depressed left ventricular function and      prior percutaneous intervention.  3. Ventricular tachycardia, nonsustained, symptomatic with inducible      sustained ventricular tachycardia.  4. Status post implantable cardioverter/defibrillator for the above.  5. Chronotropic incompetence with dual-chamber device because of it.  6. Dyspnea on exertion.  7. Amiodarone for the  above.   [name], [name] is doing okay from an arrhythmia point of view.  He got  out of the office, but I would suggest we decrease his amiodarone from  400 to 300 mg a day for a couple of months and then a repeat follow-up  visit, if his VT is quiet, would reduce it further to 200 mg a day.   I also reprogrammed his device, as noted above, to improve exercise  intolerance, and this, at least at initial blush, seemed to be doing  better.   Thanks very much for asking [place] to participate in his care.    Sincerely,      Roy Salvia, MD, [facility]  Electronically Signed    SCK/MedQ  DD: 04/29/2007  DT: 04/29/2007  Job #: [id number]   CC:    [name], M.D.

## 2011-04-15 NOTE — Assessment & Plan Note (Signed)
OFFICE VISIT   ISLEY, ZINNI  DOB:  01/14/1936                                       04/24/2009  CHART#:[id number]   The patient had some eschar over his distal incision in the right leg  for the below knee popliteal artery exposure.  He comes in today to have  a wound check.  This wound has now completely healed.  He has a palpable  graft pulse in the right and a warm and well-perfused foot.  He has some  mild swelling which has been stable.   Overall I am pleased with his progress and he is due back I believe in  September for a followup scan of his abdominal aortic aneurysm.  He  knows to call sooner if he has problems.   Di Kindle. Edilia Bo, M.D.  Electronically Signed   CSD/MEDQ  D:  04/24/2009  T:  04/25/2009  Job:  [id number]

## 2011-04-15 NOTE — Cardiovascular Report (Signed)
[name], Roy NO.:  [id number]   MEDICAL RECORD NO.:  [id number]          PATIENT TYPE:  INP   LOCATION:  2503                         FACILITY:  [facility]   PHYSICIAN:  [name]. [name], M.D. DATE OF BIRTH:  02/25/1936   DATE OF PROCEDURE:  12/14/2008  DATE OF DISCHARGE:                            CARDIAC CATHETERIZATION   PROCEDURE:  1. Left cardiac cath with selective left and right coronary      angiography, visualization of saphenous vein graft, LIMA graft, LV      graft via right groin using Judkins technique.  2. Successful PTCA to saphenous vein graft to RCA using 2.5 x 15-mm      long Voyager balloon using EZ FilterWire for distal protection.  3. Successful deployment of 3.5 x 33-mm long Cypher drug-eluting stent      and mid saphenous vein graft to RCA.  4. Successful post dilatation of the stent using 3.75 x 20-mm long [place]      Voyager balloon.  5. Successful deployment of 3.5 x 13-mm long Cypher drug-eluting stent      in proximal saphenous vein graft to RCA overlapping with the      proximal edge of the distal stent.  6. Successful post dilatation of the stent using same 3.75 x 20-mm      long [place] Voyager balloon.   INDICATIONS FOR PROCEDURE:  [name] is a 75 year old white male with  past medical history significant for coronary artery disease, history of  MI in the past status post multiple PCIs in 1995, status post CABG x5 in  2000, hypertension, history of ventricular tachycardia status post BiV  ICD in February of 2008, history of recurrent GERD, depression,  complaints of recurrent retrosternal chest pain associated with nausea,  resolved with rest and sublingual nitro.  States his breathing has  improved after stopping his amiodarone.  Denies any palpitation,  lightheadedness, syncope.  Denies PND, orthopnea, leg swelling.  The  patient underwent Persantine Myoview on December 06, 2008, which was  nondiagnostic study due to large amount  of overlying GERD activity.  The  patient refused for further reevaluation and wanted to proceed with ICD.   PAST MEDICAL HISTORY:  As above.   PAST SURGICAL HISTORY:  He has CABG x5 and back surgery in the past.  He  had cholecystectomy in the past and history of bleeding ulcer in the  past.   ALLERGIES:  Allergic to CODEINE, PENICILLIN, STATIN, MORPHINE, and IV  DYE.   MEDICATIONS:  1. Enteric-coated aspirin 81 mg p.o. daily.  2. Plavix 75 mg p.o. daily.  3. Bystolic 5 mg p.o. daily.  4. Isosorbide dinitrate 20 mg 3 times a day.  5. Niaspan 500 mg p.o. b.i.d.  6. Protonix 40 mg p.o. daily.  7. Nitrostat 0.4 mg sublingual p.r.n.  The patient was on amiodarone, it was discontinued by Pulmonary as it  was felt to have pulmonary toxicity by amiodarone.  The patient does  state he feels better and breathing better after stopping the  amiodarone.  The patient had recent interrogation  of the ICD and was  noted to have significant V-tach and was advised to restart his  amiodarone but till now, the patient has not restarted his amiodarone.   SOCIAL HISTORY:  Married, has 2 children.  Retired.  Smoked 1-3 packs  per day for 30+ years, quit in 1990.  No history of alcohol abuse.  He  worked as a Archivist in the past.   FAMILY HISTORY:  Father died of MI at the age of 21.  Mother died at the  age of 84 with a stroke.  One sister had brain aneurysm and died of  brain aneurysm at the age of 71.  One brother died of ruptured brain  aneurysm, also he had cancer.  One brother had coronary artery disease,  requiring CABG and multiple PCIs.   PHYSICAL EXAMINATION:  GENERAL:  He is alert, awake, oriented x3, in no  acute distress.  VITAL SIGNS:  Blood pressure was 130/70, pulse was 68 regular.  EYES:  Conjunctivae were pink.  NECK:  Supple.  No JVD.  No bruit.  LUNGS:  Clear to auscultation without rhonchi or rales.  CARDIOVASCULAR:  S1 and S2 was normal.  There was soft systolic  murmur.  ABDOMEN:  Soft.  Bowel sounds are present.  Nontender.  EXTREMITIES:  There is no clubbing, cyanosis, or edema.   IMPRESSION:  New-onset angina rule-out progression of coronary artery  disease, coronary artery disease, history of myocardial infarction in  the past status post multiple PCIs and CABG in the past, history of  ventricular tachycardia status post BiV ICD, hypertension,  hypercholesteremia, gastroesophageal reflux disease, depression, history  of AAA.  I discussed with the patient at length regarding repeating  Persantine Myoview versus left cath, possible PTCA stenting, and its  risks and benefits i.e. death, MI, stroke, need for emergency CABG, risk  of restenosis, local vascular complications, distal embolization etc.  and consented for PCI.   PROCEDURE:  After obtaining the informed consent, the patient was  brought to the cath lab and was placed on fluoroscopy table.  Right  groin was prepped and draped in usual fashion.  A 2% Xylocaine was used  for local anesthesia in the right groin.  With the help of thin-wall  needle, a 6-French arterial and venous sheaths were placed.  Both the  sheaths were aspirated and flushed.  Next, a 6-French left Judkins  catheter was advanced over the wire under fluoroscopic guidance into the  ascending aorta.  Wire was pulled out, the catheter was aspirated and  connected to the manifold.  Catheter was further advanced and engaged  into left coronary ostium.  Multiple views of the left system were  taken.  Next, catheter was disengaged and was pulled out over the wire  and was replaced with a 6-French right Judkins catheter, which was  advanced over the wire under fluoroscopic guidance into the ascending  aorta.  Wire was pulled out.  The catheter was aspirated and connected  to the manifold.  Catheter was further advanced and engaged into right  coronary ostium.  Multiple views of the right system were taken.  Next,  the  catheter was disengaged and was engaged into saphenous vein graft to  RCA and multiple views of this graft were taken.  The catheter was  disengaged and was engaged into saphenous vein graft to obtuse marginal.  Multiple views of this graft were taken.  Next, catheter was disengaged  and was engaged into the LIMA  to LAD.  Multiple views of this graft were  taken.  A single view of this graft was taken.  Next, the catheter was  disengaged and was pulled out over the wire and was replaced with 6-  French pigtail catheter, which was advanced over the wire under  fluoroscopic guidance up to the ascending aorta.  Wire was pulled out.  The catheter was aspirated and connected to the manifold.  Catheter was  further advanced across the aortic valve into the LV.  LV pressures were  recorded.  Next, LV graft was done in 30-degree RAO position.  Postangiographic pressures were recorded from LV and then pullback  pressures were recorded from the aorta.  There was no significant  gradient across the aortic valve.  Next, pigtail catheter was pulled out  over the wire.  Sheaths were aspirated and flushed.   FINDINGS:  LV showed global hypokinesia, EF of 25%.  Left main was  patent.  LAD has 70% proximal stenosis and 20-30% mid stenosis.  Diagonal I was very very small.  Diagonal II was small, which was  patent.  Left circumflex was small and tapers down in AV groove.  OM-1  was 100% occluded.  RCA is 40-50% proximally diffused.  In-stent  restenosis and then 100% occluded beyond midportion.  Saphenous vein  graft to RCA at proximal and mid sequential 80 and 95% stenosis with  haziness.  Saphenous vein graft to OM-1 has 20-30% proximal and mid  sequential stenosis.  LIMA to LAD is patent.  Interventional procedure  successful PTCA to mid and proximal saphenous vein graft to RCA was done  using 2.5 x 15-mm long Voyager balloon using EZ FilterWire for distal  protection and then 3.5 x 33-mm long Cypher  drug-eluting stent was  deployed at 15 atmospheric pressures.  The stent was postdilated using  3.75 x 20 mm long [place] Voyager balloon going up to 18 atmospheric  pressure.  Angiogram showed proximal edge dissection with haziness and  then 3.5 x 13-mm long Cypher drug-eluting stent was deployed in proximal  saphenous vein graft to RCA overlapping the proximal end of the distal  stent at 18 atmospheric pressures.  Stent was postdilated using 3.75 x  20-mm long [place] Voyager balloon going up to 18 atmospheric pressure.  Lesion dilated from 80 and 95% to less than 10% residual with excellent  TIMI grade III distal flow without evidence of dissection or distal  embolization.  Very small amount of fullness atherosclerotic plaque  material was retrieved in the filter basket.  The patient received  weight-based Angiomax and 300 mg of Plavix during the procedure.  The  patient tolerated the procedure well.  There were no complications.  The  patient was transferred to recovery room in stable condition.      [name]. [name], M.D.  Electronically Signed     MNH/MEDQ  D:  12/14/2008  T:  12/15/2008  Job:  [id number]

## 2011-04-15 NOTE — Assessment & Plan Note (Signed)
OFFICE VISIT   DJON, TITH  DOB:  09-07-1936                                       01/30/2009  CHART#:[id number]   I saw the patient in the office today for continued followup of his  aneurysm.  This is a pleasant 75 year old gentleman who I had seen in  consultation in September of 2009 with a 4.8 cm infrarenal abdominal  aortic aneurysm.  Of note, this is actually a patient of Dr. 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  Shortly  after the repair of the left popliteal artery aneurysm he had  exploration of the right popliteal artery according to the patient  although I do not have these records.  When I saw him back in September  I explained we generally would not consider repair of an abdominal  aortic aneurysm unless it reached 5.5 cm in maximum diameter and he was  scheduled for a 6 month followup study.   Since I saw him last he has had no significant abdominal pain or back  pain with the exception of some mild chronic low back pain.   PAST MEDICAL HISTORY:  Significant for coronary artery disease.  He had  two stents placed by Dr. Sharyn Lull in January of this year.  He is on  Plavix currently.   REVIEW OF SYSTEMS:  CONSTITUTIONAL:  Since that time he has only had  some mild chest pain.  He has had no significant palpitations or  arrhythmias.  He denies any significant dyspnea on exertion.  He has had  no significant weight loss or weight gain.  He does have a history of  reflux.  GU:  He has some frequency of urination.  VASCULAR:  He does have some mild calf claudication bilaterally more  significant on the right side.  He has had no rest pain or nonhealing  ulcers.  NEUROLOGICAL:  He has occasional headaches.  ORTHO/SKIN:  He does have history of arthritis.  ENT:  He has had no recent change in his eyesight or hearing.  RHEUMATOLOGIC:  He has had no bleeding problems or clotting disorders.   SOCIAL HISTORY:   He is married.  He has 2 children.  He quit tobacco in  1990.   MEDICATIONS:  Documented on the medical history form in his chart.   PHYSICAL EXAMINATION:  General:  This is a pleasant 75 year old  gentleman who appears his stated age.  Vital signs:  Blood pressure is  135/77, heart rate is 75.  HEENT:  Unremarkable.  Neck:  Supple.  He  does have a soft left carotid bruit.  Cardiac:  He has a regular rate  and rhythm.  Lungs:  Clear bilaterally to auscultation.  Abdomen:  Soft  and nontender.  His aneurysm is palpable and nontender.  He has two  abdominal incisions.  He has had previous abdominal surgery for stomach,  gallbladder and appendix on separate occasions.  He has palpable femoral  pulses bilaterally and a palpable right popliteal pulse and left  posterior tibial pulse.  Both feet are adequately perfused without  ischemic ulcers and there is no evidence of atheroembolic disease.   Ultrasound in our office today shows that he has a right popliteal  artery aneurysm that measures 4.9 cm in maximum diameter.  On the left  side where he has had a previous repair there is an area of increased  velocity to 477 cm/sec noted within the graft.   Maximum size of his abdominal aortic aneurysm is now measured at 5.65  cm.  This is obviously a complicated situation in that he now has an  abdominal aortic aneurysm at a size that he should be considered for  elective repair.  In addition he has a right popliteal artery aneurysm  and appears to have evidence of stenosis in his bypass graft on the left  leg.  The situation is further complicated by his recent PTCA and he is  on Plavix for this.  I think he will need an angiogram to further assess  his options and likely a CT scan with and without contrast to help  decide if he is a candidate potentially for a stent graft.  This would  be ideal if he were a candidate for stent graft and this way we could  leave him on his Plavix.  If he were  to undergo open repair on Plavix  given all his multiple abdominal procedures I think he would be at high  risk for bleeding complications.  He is scheduled to see Dr. Sharyn Lull and  he will obviously need cardiac clearance before any elective surgery.  In addition, he will need a duplex to work up his left carotid bruit.  We will make further recommendations concerning his aneurysms after his  workup is complete.  I will discuss the case with Dr. Hart Rochester.  Please  put a copy of this letter on my desk.   Di Kindle. Edilia Bo, M.D.  Electronically Signed   CSD/MEDQ  D:  01/30/2009  T:  01/31/2009  Job:  [id number]

## 2011-04-15 NOTE — Procedures (Signed)
CAROTID DUPLEX EXAM   INDICATION:  Bruit.   HISTORY:  Diabetes:  No.  Cardiac:  No.  Hypertension:  No.  Smoking:  Previous.  Previous Surgery:  No.  CV History:  Currently asymptomatic.  Amaurosis Fugax No, Paresthesias No, Hemiparesis No.                                       RIGHT             LEFT  Brachial systolic pressure:         138               142  Brachial Doppler waveforms:         Normal            Normal  Vertebral direction of flow:        Antegrade         Antegrade  DUPLEX VELOCITIES (cm/sec)  CCA peak systolic                   136/33 (distal)   79  ECA peak systolic                   166               98  ICA peak systolic                   135               90  ICA end diastolic                   30                25  PLAQUE MORPHOLOGY:                  Calcific          Calcific  PLAQUE AMOUNT:                      Moderate          Mild  PLAQUE LOCATION:                    ICA/ECA/CCA       ICA/ECA/CCA   IMPRESSION:  1. Doppler velocities are suggestive of 40-59% stenosis of the right      internal carotid artery; however, this increase in velocity is most      likely due to the stenosis of the right distal common carotid      artery/bifurcation region.  2. A 1-39% stenosis of the left internal carotid artery.   ___________________________________________  Di Kindle. Edilia Bo, M.D.   CH/MEDQ  D:  02/20/2009  T:  02/20/2009  Job:  [id number]

## 2011-04-15 NOTE — Discharge Summary (Signed)
[name], [name] NO.:  [id number]   MEDICAL RECORD NO.:  [id number]          PATIENT TYPE:  INP   LOCATION:  2038                         FACILITY:  [facility]   PHYSICIAN:  Di Kindle. Edilia Bo, M.D.DATE OF BIRTH:  [birth year]   DATE OF ADMISSION:  03/14/2009  DATE OF DISCHARGE:  03/17/2009                               DISCHARGE SUMMARY   ADMISSION DIAGNOSIS:  Right popliteal artery aneurysm with previous  repair with vein in 1982.   FINAL DISCHARGE DIAGNOSES:  1. Right popliteal artery aneurysm, status post redo repair.  2. Coronary artery disease with recent percutaneous transluminal      coronary angioplasty by Dr. Sharyn Lull and previous history of 2 stent      placements and he is on Plavix.  3. Hypertension.  4. Hyperlipidemia.  5. Peptic ulcer disease, status post gastrectomy in 1978.  6. History of pernicious anemia.  7. Cholecystectomy in 1979.  8. History of polymyalgia rheumatica.  9. History of appendectomy.  10.Tobacco use, but quit in 1990.  11.Mild postoperative hyperglycemia without history of diabetes      mellitus and normal preoperative glucose.   ALLERGIES:  CODEINE, MORPHINE and intolerance to STATINS.   PROCEDURES:  1. On March 14, 2009, redo repair of right popliteal artery aneurysm      with greater saphenous vein graft, above-knee to below-knee      popliteal artery bypass graft by Dr. Waverly Ferrari.  2. On March 16, 2009 ,preliminary ABIs show 0.7 on the right and 1.04      on the left.  3. Consult physical therapy.   BRIEF HISTORY:  Mr. Cure is a 75 year old Caucasian male who had  previous repair of right popliteal artery aneurysm back in 1982 using  vein from the right thigh.  He later presented with an aneurysm to the  right popliteal space and he had developed a large aneurysm in his vein  graft behind the knee.  Given the risk of rupture or thrombosis and  ischemia, likely repair was recommended.  He was given  cardiac clearance  by Dr. Sharyn Lull, but felt the patient should remain on Plavix.   HOSPITAL COURSE:  Mr. Lawhorn was electively admitted to the [facility] on March 14, 2009.  He underwent the previously mentioned  procedure.  Postoperatively, he was extubated, neurologically intact and  transferred to Step-Down Unit 3300 where he remained for the first day  after surgery.  He did have a low-grade fever postoperatively which was  felt most likely secondary to atelectasis and pulmonary toilet and  incentive spirometry was encouraged.  He had a Jackson-Pratt drain  placed intraoperatively, but had minimal output and was discontinued on  postop day #1 as well as his Foley catheter.  He was felt appropriate to  transfer to telemetry unit 2000 where he remained until discharge.  Incisions remained clean and dry without signs of infection.  He did  have some moderate right lower extremity edema since surgery, but has  been on DVT prophylaxis postoperatively and it was felt more surgical  related.  He was able to ambulate independently with the use rolling  walker.  He was evaluated by Physical Therapy who felt no acute needs.  On postop day #3, he was felt appropriate for discharge home.  He  remained hemodynamically stable, blood pressure 106/60, heart rate in  the 60s, in sinus rhythm.  Current temperature of 98.9, oxygen  saturation 96% on 2 liters.  We anticipate no difficulty and able to  wean him from supplemental oxygen.  His bowel and bladder both were  functioning appropriately.  He had 1+ dorsalis pedis pulse on the right.  Left foot also was warm to touch.   His postoperative labs showed a sodium of 137, potassium of 2.9, glucose  of 145, BUN of 8, and creatinine of 0.9.  White count 6.8, hemoglobin  10, hematocrit 29.8, and platelet count of 196.  Of note, his  preoperative glucose was normal at the 98.   DISPOSITION:  Mr. Pollack was felt appropriate for discharge home on  March 17, 2009, in an improving and stable condition.   DISCHARGE MEDICATIONS:  1. Protonix 40 mg p.o. q.a.m.  2. Plavix 75 mg q.a.m.  3. Bystolic 5 mg q.a.m.  4. Aspirin 81 mg q.a.m.  5. Spiriva 1 puff q.a.m.  6. Symbicort 80 mcg q.a.m. p.r.n.  7. Temazepam 30 mg p.o. q.p.m.  8. Nitroglycerin 0.4 mg sublingual p.r.n. chest pain per his home      regimen.  9. Celexa 20 mg p.o. q.a.m.  10.Percocet 5/325 mg 1-2 tablets p.o. q.4 h. p.r.n. pain.   DISCHARGE INSTRUCTIONS:  Continue heart-healthy diet.  Increase activity  slowly.  No driving or heavy lifting for the next 2-3 weeks.  May shower  and clean incisions gently with soap and water.  He should apply dry  gauze dressing to his groin incision daily and his other incisions as  needed for drainage.  He will see Dr. Edilia Bo in approximately 3 weeks  with ABIs.  He should call sooner if he has fever greater than 101,  redness or drainage from any his incision sites, or increased pain.  He  will have the staple removal appointment in our office in 7-10 days.  Office will contact him regarding specific appointment date and time.      Jerold Coombe, P.A.      Di Kindle. Edilia Bo, M.D.  Electronically Signed    AWZ/MEDQ  D:  03/17/2009  T:  03/18/2009  Job:  [id number]   cc:   Samul Dada, M.D.  [name]. Sharyn Lull, M.D.  Gaspar Garbe, M.D.

## 2011-04-15 NOTE — Cardiovascular Report (Signed)
[name], [name]                 ACCOUNT NO.:  [id number]   MEDICAL RECORD NO.:  [id number]          PATIENT TYPE:  INP   LOCATION:  6529                         FACILITY:  [facility]   PHYSICIAN:  Thereasa Solo. Little, M.D. DATE OF BIRTH:  Jan 03, 1936   DATE OF PROCEDURE:  08/23/2007  DATE OF DISCHARGE:                            CARDIAC CATHETERIZATION   This 75 year old male comes in with GI complaints of epigastric  discomfort and recurrent nausea.  He had just had a negative upper GI  evaluation.  These symptoms are similar to what he had at his previous  intervention a year ago and also at the time of his bypass surgery in  2000.  His EKG is unremarkable, but he is brought to the cath lab for  evaluation for ischemia.   After obtaining informed consent the patient was prepped and draped in  the usual sterile fashion exposing the right groin.  Following local  anesthetic with 1% Xylocaine, the Seldinger technique was employed, and  a 5-French introducer sheath was placed in the right femoral artery.  Left and right coronary arteriography and graft visualization was  performed.  Following this, percutaneous intervention to the saphenous  vein graft to the OM was performed.   COMPLICATIONS:  None.   TOTAL CONTRAST USED:  125 mL.   EQUIPMENT:  5-French Judkins diagnostic catheters.  Interventional  equipment, see below.   RESULTS:  1. Hemodynamic monitoring.  His central aortic pressure was 170/34.      His left ventricular pressure was 173/9 and he has a left      ventricular end-diastolic pressure of 18.  2. Ventriculography.  Ventriculography in the RAO projection done at      the end of the procedure showed the apical segment and the distal      septum to be mildly hypokinetic.  The ejection fraction, however,      appeared to be 45% and the patient does have an AICD/pacemaker.   Coronary arteriography:  1. Left main normal.  2. LAD.  There was a proximal 60% hypolucent area  in the LAD.  There      was bidirectional flow in the distal LAD from the internal mammary      artery, and the first diagonal was well visualized and free of      significant disease.  3. Circumflex.  Circumflex was 100% occluded proximally.  4. Right coronary artery.  Right coronary artery had a stent in its      midportion.  The vessel had been previously occluded at the acute      angle of the heart and this has not changed.  There is tubular      stenosis throughout the entire stent in the RCA of about 40% before      the total obstruction.  (This is old).  5. Saphenous vein graft to the OM.  There is a stent in the middle of      this graft.  There is in-stent 80% napkin ring type appearance in      the distal portion of  the stent.  The remainder of the graft is      small with some proximal eccentric 30% narrowing.  The OM vessel is      also small with mild irregularities.  6. Saphenous vein graft to the PDA.  The graft has mild to moderate      irregularities of 20-40% throughout the mid and proximal segments.      The PDA and posterolateral vessels were visualized and they also      have mild irregularities.  7. Internal mammary artery to the LAD.  The internal mammary artery      was widely patent.  The LAD had mild irregularities.  The proximal      subclavian before the internal mammary artery comes off was dilated      and had mild irregularities but no high-grade stenoses.   Percutaneous intervention was then undertaken with a 6-French system.  The saphenous vein graft to the OM that had in-stent restenosis was  addressed.  The initial ACT was 216, the patient was still started on  Angiomax, and the ACT at the end of the procedure was 355.   A 6-French JR-4 guide catheter was used.  It did not give good backup  but was adequate.  A long Luge wire was placed down the vein graft to  the OM and well into the OM system itself.  A 3.25 x cutting balloon was  placed in such a  manner that it covered the area of stenosis but never  extended out of the stent.  A total of four inflations, 8 x 46, 10 x 46,  10 x 27 and 10 x 49 was performed.  The area of 80% in-stent restenosis  pre now appeared to be normal post intervention.  There was no evidence  of any dissection, thrombus or distal embolization.  Of note, the  patient had no discomfort with the inflations.   He should be ready for discharge in the morning.  His blood pressure is  slightly elevated at this point but should come down.  He is still on IV  nitroglycerin.           ______________________________  Thereasa Solo. Little, M.D.     ABL/MEDQ  D:  08/23/2007  T:  08/24/2007  Job:  [id number]   cc:   Madaline Savage, M.D.

## 2011-04-15 NOTE — Procedures (Signed)
DUPLEX DEEP VENOUS EXAM - LOWER EXTREMITY   INDICATION:  Right leg edema.   HISTORY:  Edema:  Right leg since surgery.  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  Pain:  Chronic right leg pain.  PE:  No.  Previous DVT:  Possibly.  Anticoagulants:  Plavix.  Other:  Lasix.   DUPLEX EXAM:                CFV   SFV   PopV  PTV    GSV                R  L  R  L  R  L  R   L  R   L  Thrombosis    0  0  0     0     0      No  Spontaneous   +  +  +     +     +  Phasic        +  +  +     +     +  Augmentation  +  +  +     +     +  Compressible  +  +  +     +     +  Competent     0  +  0     P     +   Legend:  + - yes  o - no  p - partial  D - decreased   IMPRESSION:  1. No evidence of right leg deep vein thrombus or Baker's cyst.  2. No right greater saphenous vein was identified.  3. Deep venous incompetence is seen in the right common femoral vein,      superficial femoral vein and popliteal vein.  4. Limited imaging revealed a patent right above-knee to below-knee      popliteal bypass graft.  5. 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    _____________________________  Di Kindle. Edilia Bo, M.D.   MC/MEDQ  D:  05/25/2009  T:  05/25/2009  Job:  [id number]

## 2011-04-15 NOTE — Letter (Signed)
August 04, 2007    Gaspar Garbe, M.D.  [address]  [place], [place] [address]   RE:  [name], [name]  MRN:  [id number]  /  DOB:  10-07-1936   Dear Dr. 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  He has  self-referred himself to the pulmonary clinic because of shortness of  breath that he has had that has worsened over the past 6 weeks.  He  tells me that since he suffered his myocardial infarction in January of  2008, he has had some shortness of breath.  Prior to that he has had no  shortness of breath.  For the past 3 months he has had some wheezing at  night. And for the last 6 weeks the dyspnea is particularly worse and it  is progressive.  It is increased with activities like bending over,  doing laundry, climbing stairs.  He says that he can still walk 1 block  but only very slowly.  He states that he last climbed one flight of  stairs over 6 months ago.   For the past 6 weeks he has also had some chest pain that is sternal in  origin and between the shoulder blades.  It comes on even at sitting or  at rest, it comes and goes.  The duration of chest pain is between 5 and  20 minutes, it occurs 3 to 4 times a day, and this chest pain did not  exist before the myocardial infarction.   For the past 6 weeks he has also had some amount of paroxysmal nocturnal  dyspnea but he denies any orthopnea, edema, fever, hemoptysis.   Of note, when he was seen by Dr. Graciela Husbands, in Electrophysiology, on Apr 29, 2007, he increased the threshold in his atrial implantable cardioverter-  defibrillator device and this seemed to temporarily relieve his dyspnea  in the clinic.   PAST MEDICAL PROBLEMS:  1. COPD.  Per history COPD was diagnosed 10 years ago but he has quit      taking inhalers.  In 2002 his FEV1 was 2.7L, 87% predicted, FVC was      3.7L, 95% predicted. Chest CT scan in 2007 showed severe COPD.  2. Coronary artery disease.  Status post CABG  in 2000.  In December,      2007, he had a myocardial infarction, according to him.  Review of      his records he had a stent placed to his saphenous vein graft to      obtuse marginal.  He also had an AICD placed for inducible      ventricular tachycardia on January 07, 2007.  He has not followed      up his myocardial infarction with cardiac rehab because his heart      did not withstand it.  3. Gastroesophageal reflux disease.  He states this was present in the      past but not now.  He states his reflux symptoms were cough and      throat burning and his current symptoms are not consistent with      acid/GERD.  4. Decreased hemoglobin for the past 20 years.  He states that he has      had to get IV iron once a month in order to keep his hemoglobin      levels up.  The cause of his anemia is not known.  5. Status post  left lower extremity DVT in 1984.  6. Aortic aneurysm on serial ultrasound followup.  7. Polymyalgia rheumatica.  8. History of epistaxis since being on Plavix from January, 2008.   PAST SURGICAL HISTORY:  1. Status post cholecystectomy in 1982.  2. Status post back surgery in 1981.  3. Status post CABG, 5-vessel, 2000.  4. Status post appendectomy in 1946.  5. Status post AICD placement January 27, 2007, for inducible V-tach.   ALLERGIES:  1. HE HAS ALLERGY TO BETA-BLOCKERS BECAUSE THIS MAKES HIM BRADYCARDIC      AND HYPOTENSIVE.  2. HE IS ALSO ALLERGIC TO:      a.     LIPITOR.      b.     PENICILLIN.      c.     MORPHINE.      d.     CODEINE.      e.     ZOFRAN.   CURRENT MEDICATIONS:  1. Aspirin.  2. Amiodarone 400 mg b.i.d. from January onward.  This has been      decreased to 300 mg once daily from May, 2008.  3. Pantoprazole.  4. Lisinopril.  5. Plavix.  6. Citalopram.  7. IV iron once a month.   SOCIAL HISTORY:  1. He smoked 2 to 3 packs a day for 46 years but quit in 1990.  2. He is married.  3. He has children.  4. He lives at  [place].  5. He used to be a Geneticist, molecular in church, but for the last 2 weeks,      because of his dyspnea, he had to give it up.  6. Within the last 3 months he has traveled to the beach.   FAMILY HISTORY:  1. Daughter died in 44 at the age 99 due to some form of cancer in      the lung.  2. His sister has emphysema.  3. A brother and a sister suffer allergies.  4. Another sister has asthma.  5. Brother, sister and father have heart disease.  6. He himself has had clotting disorders.  7. Daughter with stomach and lung cancer.  8. Brother with lung cancer.   REVIEW OF SYSTEMS:  This is documented in detail in the questionnaire.  Significant positives are shortness of breath, chest pain, irregular  heartbeat, weight change, headaches, depression, joint stiffness.   PHYSICAL EXAM:  VITAL SIGNS:  Weight of 174.2 pounds, temperature 97.6,  blood pressure 140/80, pulse of 64, saturation 98% on room air.  GENERAL EXAM:  Seated comfortably in the exam room.  CENTRAL NERVOUS SYSTEM:  Alert and oriented x3.  Speech and ambulation  are normal.  HEENT EXAM:  No neck nodes, supple neck, no elevated JVP.  CHEST EXAM:  Median sternotomy scar present.  RESPIRATORY EXAM:  Air entry equal, normal breath sounds, no crackles.  CARDIOVASCULAR EXAM:  S1, S2 heard.  Normal rate and rhythm.  No  gallops, no murmurs.  ABDOMEN:  A midline laparotomy scar present.  Soft, normal bowel sounds,  no mass.  EXTREMITIES:  No cyanosis, no clubbing, no pedal edema.  BACK EXAM:  No kyphosis, no scoliosis.  SKIN EXAM:  Intact.   LABORATORY EVALUATION:  CT scan of the chest done in December, 2007,  shows severe COPD.  Chest x-ray done on January 02, 2007, for AICD  placement, shows a normal chest with COPD.  CT scan of the chest done on  January 07, 2007, shows postop changes  and presence of COPD.   Echocardiogram done on January 07, 2007, shows an LV ejection fraction  of 55%, paradoxical  intraventricular septal movement and mild mitral  valve prolapse.  Cardiac MRI done on January 06, 2007, shows lateral to  inferior wall severe hypokinesis with full thickness scar, EF of 52%,  mild left atrial enlargement but normal right-sided chambers.   Laboratory evaluation done on February 10, 2007, shows creatinine of 1.2  mg% and a hemoglobin of 12.4 g%.   I did an exertional pulse oximetry in clinic.  At rest, his heart rate  was 64 with a pulse ox of 99% on room air after walking 3 laps.  After  walking 2 laps, he stopped because of shortness of breath and headache  but his heart rate increased only up to 76% and his saturation remained  at 94%   ASSESSMENT AND PLAN:  In summary, this is a 75 year old previously heavy  smoker with presence of chronic obstructive pulmonary disease on CT  chest 2007/2008 but normal PFTs in 2002.  Also has coronary artery  disease with a stent placed to the saphenous vein graft early this year  in January, 2008.  Since then he has had progressive dyspnea on  exertion.  He is currently dyspneic for activities of daily living.  Adjustment of pacer in May 2008 seemed to alleviate dyspnea. Likely  etiologies of dyspnea are either copd, chronotropic cardiac  incompetence, amiodarone toxicity, or severe deconditioning.   I have recommended an echocardiogram to determine current cardiac  function, a full set of PFTs and a 6-minute walk test.   I will see him after all the tests.    Sincerely,      Kalman Shan, MD  Electronically Signed    MR/MedQ  DD: 08/08/2007  DT: 08/08/2007  Job #: [id number]   CC:   Madaline Savage, M.D.  Duke Salvia, MD, [facility]

## 2011-04-15 NOTE — Letter (Signed)
October 20, 2007    Madaline Savage, M.D.  [phone], [address]  [place] [address]   RE:  GEOFREY, SILLIMAN  MRN:  [id number]  /  DOB:  [birth year]   The patient had an ICD implanted for nonsustained ventricular  tachycardia in the setting of ischemic heart disease and he has been  doing well from that point of view.  He has intercurrently undergone  stenting.  He continues to have discomfort.  This is both chest and  back.  He is scheduled to have abdominal ultrasonography later this  month, but I suggested that he call Dr. Elsie Lincoln in the interim.   CURRENT MEDICATIONS:  1. Amiodarone 200 mg a day.  2. Plavix.  3. Lisinopril.  4. Aspirin.   PHYSICAL EXAMINATION:  VITAL SIGNS:  Blood pressure was mildly elevated  at 153/89, pulse 70.  LUNGS:  Clear.  HEART:  Sounds were regular.  NECK:  Neck veins were 7-8 cm.  ABDOMEN:  Soft.  The abdominal pulsation was not appreciated.  EXTREMITIES:  No edema.   Interrogation of his Medtronic Entrust Pulse Generator demonstrated P  wave 4.1, impedance of 584, threshold of both chambers of 1 volt at 0.4.  The R wave was 5.1 with impedance of 456.  High voltage impedances were  39/48.   Interestingly review of his activity monitor demonstrates that there is  a significant decrease around the time of his reprogramming in late May  where there is a decrease of about 50% or 60% of his overall activity.  It is not clear yet what the explanation of that is.   LABORATORY DATA:  TSH that was mildly elevated in September and liver  function tests were also obtained in September and they were normal.   IMPRESSION:  1. Ischemic heart disease.      a.     Status post revascularization with problems with in-stent       restenosis, it sounds like.  2. Depressed left ventricular function.  3. Inducible ventricular tachycardia.  4. Amiodarone and status post implantable cardioverter defibrillator      for the above.  5. Elevated TSH.   We  will need to plan to check a T4 and a TSH today to make sure that  this is not progressive.  He will be seeing Dr. Loreta Ave tomorrow.  I have  asked him to follow up with Dr. Elsie Lincoln regarding his chest pain so as  not to leave it for his next visit since the symptoms have not abated.  We have given samples today of Plavix.  We will see him again in one  years' time.    Sincerely,      Duke Salvia, MD, [facility]  Electronically Signed    SCK/MedQ  DD: 10/20/2007  DT: 10/21/2007  Job #: [id number]   CC:    Anselmo Rod, M.D.

## 2011-04-15 NOTE — Op Note (Signed)
[name], [name]                 ACCOUNT NO.:  [id number]   MEDICAL RECORD NO.:  [id number]          PATIENT TYPE:  AMB   LOCATION:  SDS                          FACILITY:  [facility]   PHYSICIAN:  Di Kindle. Edilia Bo, M.D.DATE OF BIRTH:  25-Feb-1936   DATE OF PROCEDURE:  02/05/2009  DATE OF DISCHARGE:  02/05/2009                               OPERATIVE REPORT   PREOPERATIVE DIAGNOSES:  Abdominal aortic aneurysm and right popliteal  artery aneurysm.   POSTOPERATIVE DIAGNOSES:  Abdominal aortic aneurysm and right popliteal  artery aneurysm.   PROCEDURES:  1. Ultrasound-guided access to the right common femoral artery.  2. Aortogram with bilateral lower extremity runoff.  3. Selective catheterization of the left external iliac artery (second      order catheterization).   SURGEON:  Di Kindle. Edilia Bo, MD   ANESTHESIA:  Local with sedation.   INDICATIONS:  This is a pleasant 75 year old gentleman who I had seen in  consultation in September 2009 with a 4.8-cm aneurysm.  On his most  recent followup visit in March, the aneurysm had enlarged to 5.65 cm.  Of note, the patient had had repair of the left popliteal artery  aneurysm by Dr. Hart Rochester in 1982.  He also had exploration of the right  popliteal artery according to the patient.  He is brought in for  diagnostic arteriogram to plan elective repair of these aneurysms.   TECHNIQUE:  The patient was taken to the PV lab and sedated with 1 mg of  Versed and 50 mcg of fentanyl.  Both groins were prepped and draped in  usual sterile fashion.  After the skin was anesthetized with 1%  lidocaine and under ultrasound guidance, the right common femoral artery  was cannulated and guidewire introduced into the infrarenal aorta under  fluoroscopic control.  A 5-French sheath was introduced over the wire.  A pigtail catheter was in position at the L1 vertebral body and flush  aortogram obtained.  The catheter was then advanced and a  lateral  projection obtained.  Next, I obtained an additional spot film of the  neck of the aneurysm with 15 degrees cranial-caudal angle to further  assess the neck.  Next, the pigtail catheter was brought down into the  aneurysm and oblique iliac projections were obtained.  The pigtail  catheter was exchanged for a Motarjeme catheter, which was positioned  into the left common iliac artery.  The wire was then advanced into the  external iliac artery and the Motarjeme catheter exchanged for an end-  hole catheter.  Left lower extremity runoff film was obtained through  the end-hole catheter.  This catheter was then removed and additional  right lower extremity films were obtained to the right femoral sheath.   FINDINGS:  There are single renal arteries bilaterally with no  significant renal artery stenosis identified.  The superior mesenteric  artery and celiac axis are patent.  The inferior mesenteric artery is  not visualized.  There is a large infrarenal abdominal aortic aneurysm,  the size of which cannot be accurately determined by this study.  The  aneurysm appears to end at the level of the bifurcation.  There was no  significant aneurysmal disease of the common iliac arteries are noted.  Both common iliac, external iliac, and hypogastric arteries are patent.  The neck appears to be just short of 1.5 cm.  On the left side, the  common femoral, deep femoral, superficial femoral, and popliteal  arteries are all patent.  There is occlusion of the anterior tibial  artery proximally on the left with a severely, but patent peroneal  artery, which is quite small in the left, which is occluded in the  distal third of the leg.  The posterior tibial artery is patent on the  left.   On the right side, the common femoral, deep femoral, and superficial  femoral arteries are patent.  It appears that there is a vein bypass  graft anastomosed to the distal superficial femoral artery, which  was  then anastomosed to the below-knee popliteal artery, and there appears  to be an aneurysm off this vein graft.  However, the op notes from his  surgery in 1982 are pending.  Below this, there is single-vessel runoff  on the right via the peroneal artery.  The anterior tibial and posterior  tibial arteries are occluded.   CONCLUSIONS:  1. Infrarenal abdominal aortic aneurysm.  2. Right popliteal artery aneurysm, which appears to be part of a vein      graft.  3. Tibial occlusive disease bilaterally as described above.       Di Kindle. Edilia Bo, M.D.  Electronically Signed     CSD/MEDQ  D:  02/05/2009  T:  02/05/2009  Job:  [id number]

## 2011-04-15 NOTE — Discharge Summary (Signed)
[name], [name] NO.:  [id number]   MEDICAL RECORD NO.:  [id number]          PATIENT TYPE:  INP   LOCATION:  2503                         FACILITY:  [facility]   PHYSICIAN:  [name]. Sharyn Lull, M.D. DATE OF BIRTH:  [birth year]   DATE OF ADMISSION:  12/14/2008  DATE OF DISCHARGE:  12/15/2008                               DISCHARGE SUMMARY   ADMITTING DIAGNOSES:  1. New-onset angina, rule out progression of coronary artery disease.  2. Coronary artery disease.  3. History of myocardial infarctions in the past.  4. Status post multiple percutaneous coronary interventions in the      past  5. Status post coronary artery bypass graft.  6. History of ventricular tachycardia.  7. Status post biventricular implantable cardioverter-defibrillator.  8. Hypertension.  9. Hypercholesteremia.  10.Gastroesophageal reflux disease.  11.Depression.  12.Abdominal aortic aneurysm.   DISCHARGE DIAGNOSES:  1. New-onset angina, status post percutaneous transluminal coronary      angioplasty and stenting to saphenous vein graft to right coronary      artery.  2. Coronary artery disease.  3. History of myocardial infarctions.  4. Status post multiple percutaneous coronary interventions in the      past.  5. Status post coronary artery bypass graft.  6. History of ventricular tachycardia  7. Status post biventricular implantable cardioverter-defibrillator.  8. Hypertension.  9. Hypercholesteremia.  10.Gastroesophageal reflux disease.  11.Depression.  12.Abdominal aortic aneurysm.   DISCHARGE HOME MEDICATIONS:  1. Enteric-coated aspirin 325 mg 1 tablet daily.  2. Plavix 75 mg 1 tablet daily with food.  3. Bystolic 5 mg 1 tablet daily.  4. Isosorbide dinitrate 20 mg 1 tablet 3 times daily.  5. Protonix 40 mg 1 tablet daily as before.  6. Niaspan 500 mg 1 tablet twice daily.  7. Nitrostat 0.4 mg sublingual, use as directed.   DIET:  Low salt, low cholesterol.  The patient has  been advised to avoid  sweets.   Post PTCA and stent instructions have been given.  Follow up with me in  1 week.   ACTIVITY:  Increase activity slowly.  No driving, lifting, pushing, or  pulling for 1 week.   CONDITION AT DISCHARGE:  Stable.   The patient is scheduled for phase II cardiac rehab as outpatient.  The  patient will discuss with Dr. Graciela Husbands regarding restarting amiodarone.  At  this point, he is reluctant to restart amiodarone.   BRIEF HISTORY AND HOSPITAL COURSE:  Mr. Bonny is a 75 year old white  male with past medical history significant for coronary artery disease,  history of MI in the past, status post multiple PCIs in 1995, status  post CABG in 2000, hypertension, history of ventricular tachycardia,  status post BiV ICD in February 2008, GERD, and depression.  Complains  of recurrent retrosternal chest pain associated with nausea, resolved  with rest and sublingual nitroglycerin; states his breathing has  improved after stopping amiodarone.  Denies any palpitation,  lightheadedness, or syncope.  Denies PND, orthopnea, and leg swelling.  The patient underwent Persantine Myoview on December 06, 2008, which was a  nondiagnostic study due to large amount of overlying gut activity.  The  patient refused further reevaluation and wanted to proceed with PCI.   PAST MEDICAL HISTORY:  As above.   PAST SURGICAL HISTORY:  He had CABG in 2000 as above, had back surgery  in the past, had cholecystectomy in the past, and has a history of  bleeding ulcer in the past.   ALLERGIES:  CODEINE, PENICILLIN, STATINS, MORPHINE, and IV DYE.   MEDICATIONS AT HOME:  He is on enteric-coated aspirin 81 mg p.o. daily,  Plavix 75 mg p.o. daily, Bystolic 5 mg p.o. daily, isosorbide dinitrate  20 mg 3 times a day, Niaspan 500 mg p.o. b.i.d., Protonix 40 mg p.o.  daily, and Nitrostat 0.4 mg sublingual p.r.n.   SOCIAL HISTORY:  He is married, 2 children, retired.  Smoked 1 to 3  packs per day  for 30 years, quit in 1990.  No history of alcohol abuse.  He worked as a Archivist in the past.   FAMILY HISTORY:  Father died of massive MI at the age of 3.  Mother  died of stroke at the age of 12.  One brother died of brain aneurysm; he  also had cancer.  One sister died of brain aneurysm in her 44s.  One  brother has coronary artery disease, status post CABG and multiple PCIs  in the past.   PHYSICAL EXAMINATION:  GENERAL:  He is alert, awake, and oriented x3 in  no acute distress.  VITAL SIGNS:  Blood pressure is 130/70, pulse is 68 and regular.  HEENT: Conjunctivae was pink.  NECK:  Supple.  No JVD.  No bruits.  LUNGS:  Clear to auscultation without rhonchi or rales.  CARDIOVASCULAR:  S1 and S2 was normal.  There was soft systolic murmur.  ABDOMEN:  Soft.  Bowel sounds present.  Nontender.  EXTREMITIES:  There is no clubbing, cyanosis, or edema.   LABORATORY DATA:  Postprocedure CPK is 75, MB of 4.1.  Hemoglobin was  11.1, hematocrit 32.3, white count of 6.4.  BUN 8, creatinine 1.04,  potassium 3.7, glucose is slightly elevated at 105.   BRIEF HOSPITAL COURSE:  The patient was a.m. admit and underwent left  cardiac cath with selective left and right coronary angiography,  visualization of saphenous vein graft and LIMA graft and PTCA and  stenting to saphenous vein graft to RCA using stent protection as per  procedure report.  The patient tolerated procedure well.  There were no  complications.  The patient did not have any episodes of chest pain  after the procedure.  His groin remained stable.  Phase I cardiac rehab  was called.  The patient has been ambulating in hallway without any  problems.  The patient will be discharged home on above medications, and  he will be followed up in my office in 1 week.      [name]. Sharyn Lull, M.D.  Electronically Signed     MNH/MEDQ  D:  12/15/2008  T:  12/16/2008  Job:  [id number]

## 2011-04-15 NOTE — Op Note (Signed)
NAMEMRK, BUZBY NO.:  [id number]   MEDICAL RECORD NO.:  [id number]          PATIENT TYPE:  INP   LOCATION:  3316                         FACILITY:  [facility]   PHYSICIAN:  Di Kindle. Edilia Bo, M.D.DATE OF BIRTH:  01/21/1936   DATE OF PROCEDURE:  03/14/2009  DATE OF DISCHARGE:                               OPERATIVE REPORT   PREOPERATIVE DIAGNOSIS:  Right popliteal artery aneurysm.   POSTOPERATIVE DIAGNOSIS:  Right popliteal artery aneurysm.   PROCEDURE:  Redo repair of right popliteal artery aneurysm with greater  saphenous vein graft (above-knee to below-knee popliteal artery bypass  graft).   SURGEON:  Di Kindle. Edilia Bo, MD   ASSISTANT:  Jess Barters, RNFA   ANESTHESIA:  General.   INDICATIONS:  This is a 75 year old gentleman who had had a previous  repair of a right popliteal artery aneurysm back in 1982 using vein from  the right thigh.  He later presented with an aneurysm in the right  popliteal space, and he had developed a large aneurysm in his vein graft  behind the knee.  Given the risk of rupture or thrombosis and ischemia,  elective redo repair was recommended.  Of note, there was limited vein  available.  He did undergo a vein mapping which showed what appeared to  be reasonable greater saphenous vein in the left thigh and possibly some  usable vein in the right leg.  The procedure and potential complications  were discussed with the patient preoperatively.  Of note, I did discuss  the case with Dr. Sharyn Lull preoperatively and we elected to leave the  patient on Plavix perioperatively.   TECHNIQUE:  The patient was taken to the operating room and received a  general anesthetic.  Both lower extremities were prepped and draped in  the usual sterile fashion.  An incision was made in the left proximal  thigh medially and here the saphenous vein was identified and dissected  up to the saphenofemoral junction.  I dissected down  approximately 5 cm  where the vein had branched and it appeared that some vein had  previously been taken.  There was not adequate length of vein here to be  used for the bypass.  Next, a longitudinal incision was made above the  knee on the medial aspect of the leg and through this incision, the  proximal aspect of the previous bypass graft which went from the above-  knee popliteal artery to the below-knee popliteal artery was identified  and dissected free.  The proximal segment at the anastomosis was  slightly ectatic, however, the artery too was slightly ectatic and I  dissected down to where the vein graft was more normal in size.  A  separate longitudinal incision was made beneath the knee on the medial  aspect of the right leg and here there was a segment of saphenous vein  which looked reasonable.  I dissected this out proximally and distally  all the way up to the more proximal incision and obtained an adequate  length of vein to be used for the bypass.  Branches were  divided between  clips and 3-0 silk ties.  Next, the distal anastomosis of the previous  bypass graft was identified where it was anastomosed to the below-knee  popliteal artery.  The large aneurysm could easily be palpated behind  the knee.  I tunneled medial to this and placed an umbilical tape.  The  patient was then heparinized.  A tourniquet was placed on the upper  thigh.  The leg was then exsanguinated with an Esmarch bandage after the  heparin had circulated.  The distal vein near the proximal anastomosis  was ligated and then the vein here divided and then spatulated thus  leaving the proximal anastomosis intact.  Although this was slightly  ectatic here, the vein was fairly small and I think there would a big  size mismatch to anastomosis this to the native artery.  In addition,  the vein below the anastomosis was more normal in caliber.  Therefore  the vein was sewn in a reverse fashion and the proximal  end was  spatulated and sewn end-to-end to the old vein graft using continuous 6-  0 Prolene suture.  Then, we released the tourniquet to be sure there was  no twisting and brought this vein graft to the previously created tunnel  and it was marked without twisting.  Next, the leg was exsanguinated  again and the tourniquet inflated and then a longitudinal venotomy made  over the hood of the previous anastomosis and then the vein here  spatulated.  The vein graft was then cut to appropriate length,  spatulated, and sewn end-to-end to the previous vein grafts just above  the anastomosis with 2 continuous 6-0 Prolene sutures.  Prior to  completing this anastomosis, the tourniquet was released.  The artery  back bled and flushed appropriately and the anastomosis completed.  Flow  was reestablished to the right leg and there was a good Doppler flow.  Of note, the patient had a dye allergy and as the patient not being  premedicated, I elected not to do an arteriogram.  Hemostasis was  obtained in the wounds.  He was slightly oozy having been left on  Plavix.  I therefore elected to place a 15 Blake drains in the above-  knee and below-knee incisions on the right leg.  The heparin was  partially reversed with protamine.  The left thigh incision was closed  with deep layer of 3-0 Vicryl, subcutaneous layer with 3-0 Vicryl, and  the skin closed with staples.  The incisions in the right leg were  closed with a deep layer of 2-0 Vicryl, subcutaneous layer of 2-0  Vicryl, and the skin closed with staples.  A sterile dressing was  applied.  The patient tolerated the procedure well and was transferred  to recovery room in stable condition.  All needle and sponge counts were  correct.      Di Kindle. Edilia Bo, M.D.  Electronically Signed     CSD/MEDQ  D:  03/14/2009  T:  03/15/2009  Job:  [id number]   cc:   [name]. Sharyn Lull, M.D.  Gaspar Garbe, M.D.  Samul Dada, M.D.

## 2011-04-15 NOTE — Procedures (Signed)
VASCULAR LAB EXAM   INDICATION:  Right lower extremity arterial duplex.   HISTORY:  Diabetes:  No.  Cardiac:  No.  Hypertension:  No.   EXAM:  Duplex of right popliteal artery.   IMPRESSION:  1. Sonographic evidence of right popliteal artery aneurysm with the      largest measurement of 4.22 X 4.93 cm.  2. An increased velocity of 477 cm/s noted within the lumen of the      right popliteal artery aneurysm.   ___________________________________________  Di Kindle. Edilia Bo, M.D.   AC/MEDQ  D:  01/30/2009  T:  01/30/2009  Job:  [id number]

## 2011-04-15 NOTE — Consult Note (Signed)
VASCULAR SURGERY CONSULTATION   [name], PECHACEK  DOB:  Jul 04, 1936                                       08/22/2008  CHART#:[id number]   I saw the patient in the office today in consultation concerning a 4.8-  cm infrarenal abdominal aortic aneurysm.  This is a pleasant 75 year old  gentleman who had been having some intermittent abdominal pain.  This  prompted a CT scan of the abdomen and he was noted to have a 4.8-cm  infrarenal abdominal aortic aneurysm.  Of note, in comparing this to a  previous CAT scan from July 2005, it had enlarged from 3.5 cm to 4.8 cm.  However, this is over a span of 4 years.  He was referred for vascular  consultation.   Of note, he underwent repair a left popliteal artery aneurysm in 1982 by  Dr. Hart Rochester.  He has had no significant back pain.  He does still  occasionally have intermittent abdominal pain which comes and goes.  This has been going on for 6 months.  He does not have postprandial pain  and he has had a previous cholecystectomy and previous partial  gastrectomy in 1976 for peptic ulcer disease.   PAST MEDICAL HISTORY:  Significant for hypertension,  hypercholesterolemia, and coronary artery disease.  He had a myocardial  infarction in December 2007 and had coronary revascularization in 2000.  He had stents in 2007.  He is unaware of any history of diabetes,  congestive heart failure or emphysema.  He does have a history of kidney  stones.   FAMILY HISTORY:  His mother had a stroke.  His father died at age 67  from heart attack.  He does have a brother who has had cardiac stents  and a sister with cardiovascular disease.   SOCIAL HISTORY:  He is married.  He has 2 children.  He quit tobacco in  1990.   Medications and review of systems are documented on the medical history  form in his chart.   PHYSICAL EXAMINATION:  This is a pleasant 75 year old gentleman who  appears his stated age.  His blood pressure is 118/73,  heart rate is 71.  I do not detect any carotid bruits.  Lungs are clear bilaterally to  auscultation.  On cardiac exam he has a regular rate and rhythm.  Abdomen:  Soft and nontender.  His aneurysm is palpable and nontender.  He has palpable femoral pulses and palpable popliteal pulses.  Right  popliteal pulse is somewhat prominent.  He has palpable posterior tibial  pulses bilaterally with warm, well-perfused feet.  No evidence of  atheroembolic disease.  He has no significant lower extremity swelling.  Neurologic:  Exam is nonfocal.   I did review his CAT scan which shows a 4.8 cm infrarenal abdominal  aortic aneurysm.   I have explained that we generally would not consider elective repair of  the aneurysm unless it reached 5.5 cm maximum diameter.  I have  recommended a followup ultrasound in 6 months and try to arrange this  with Dr. Hart Rochester who performed his original surgery.  However, it has  been some time since the patient saw Dr. Hart Rochester and he has asked that I  continue his followup for now.  When he returns in 6 months with his  followup ultrasound of his aneurysm we will also  check his right  popliteal artery.  Apparently, he had a known popliteal artery aneurysm  which was being followed but was lost to followup.  I did reassure him  that I did not think his abdominal pain was related to his aneurysm.   Di Kindle. Edilia Bo, M.D.  Electronically Signed  CSD/MEDQ  D:  08/22/2008  T:  08/23/2008  Job:  [id number]   cc:   [name]. Sharyn Lull, M.D.  Gaspar Garbe, M.D.

## 2011-04-18 NOTE — Consult Note (Signed)
[name], [name] NO.:  [id number]   MEDICAL RECORD NO.:  [id number]          PATIENT TYPE:  INP   LOCATION:  2923                         FACILITY:  [facility]   PHYSICIAN:  Jordan Hawks. Elnoria Howard, MD    DATE OF BIRTH:  07-06-1936   DATE OF CONSULTATION:  01/07/2007  DATE OF DISCHARGE:                                 CONSULTATION   REASON FOR CONSULTATION:  Epigastric pain, nausea and vomiting status  post cardiac catheterization.   PRIMARY CARDIOLOGIST:  Madaline Savage, M.D.   PRIMARY CARE Richanda Darin:  Gaspar Garbe, M.D.   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  The  patient suffered from a myocardial infarction this past December and was  undergoing cardiac rehab.  Subsequently, he complained of having some  shortness of breath and chest pain while on the stationary bike.  He  subsequently was admitted at the end of January for symptomatic  nonsustained VT, and subsequently because of his persistent complaints,  he underwent an elective placement of a pacemaker and defibrillator.  During the cardiac catheterization, the patient complained of having  severe epigastric pain that was associated with nausea and vomiting.  There is no apparent complications associated with the catheterization  and because of the persistence of his symptoms a GI consultation was  requested.  He denies having any prior history epigastric pain, nausea,  vomiting before this procedure.  In 2005, he was noted to have a visible  vessel with  Dieulafoy lesion in 2005, that was treated with epinephrine  by Dr. Loreta Ave and since that time he has not had any further GI  complaints.  During the previous cardiac catheterization when he had  severe nausea and vomiting as well as the epigastric pain, it was  thought to be medication-induced with Plavix; however, at this time he  maintains this medication without any discomfort.  He is stable at this  time but still with some intermittent nausea and vomiting.   PAST MEDICAL AND SURGICAL HISTORY:  As stated above.   FAMILY HISTORY:  Noncontributory.   SOCIAL HISTORY:  The patient is a former smoker, quit 1990, is married,  has good social support.   MEDICATIONS:  1. Lisinopril 10 mg p.o. every day.  2. Verapamil 120 mg p.o. daily.  3. Plavix 75 mg p.o. daily.  4. Aspirin 81 mg p.o. daily.  5. Protonix 40 mg p.o. daily.   ALLERGIES:  1. CODEINE.  2. MORPHINE.  3. LOPRESSOR.   PHYSICAL EXAMINATION:  VITAL SIGNS:  Blood pressure is 105/54, heart  rate is 60, respirations at 25, pulse ox is 96% on room air.  GENERAL:  The patient is in  no acute distress.  He is oriented.  However, he reports being fatigued.  HEENT:  Normocephalic, atraumatic.  Extraocular muscles, muscles intact.  Pupils equal, round, reactive to light.  NECK:  Supple.  No lymphadenopathy.  LUNGS:  Clear to auscultation bilaterally.  CARDIOVASCULAR:  Regular rate and rhythm without murmurs, gallops or  rubs.  ABDOMEN:  Flat, soft, mild epigastric tenderness.  No rebound or  rigidity.  Positive bowel sounds.  EXTREMITIES:  No clubbing, cyanosis or edema.   LABORATORY VALUES:  Pending at this time.   IMPRESSION:  Epigastric pain of unclear etiology.  Dr. Graciela Husbands is ordering  an acute series as well as checking laboratory values and an  echocardiogram for the possibility of any complications secondary to the  cardiac catheterization that was not immediately foreseen.  From a  gastroenterology standpoint, it  appears to be procedure, possibly  medication-related.  He did receive Fentanyl during the procedure and he  is allergic to morphine.  There may be a possible question cross  reaction with this medication.  He is hemodynamically stable at this  time.   From the gastroenterology standpoint, there is no specific  recommendation because of the acute nature of his symptoms.  Certainly  if it continues to persist,  I believe a trial of a PPI and/or  sucralfate can be tried.  At this time, from a gastroenterology  standpoint, we will follow the patient clinically and make  recommendations as needed.      Jordan Hawks Elnoria Howard, MD  Electronically Signed     PDH/MEDQ  D:  01/07/2007  T:  01/07/2007  Job:  [id number]   cc:   Madaline Savage, M.D.  Gaspar Garbe, M.D.  Amada Jupiter, Dr.

## 2011-04-18 NOTE — Assessment & Plan Note (Signed)
[facility]                         ELECTROPHYSIOLOGY OFFICE NOTE   NAME:STONEDawid, Dupriest                        MRN:          [id number]  DATE:02/04/2007                            DOB:          May 16, 1936    THE PATIENT'S ALLERGIES ARE TO BETA BLOCKERS INTOLERANCE WITH  BRADYCARDIA ON LIPITOR AND CRESTOR WITH MYALGIAS, MORPHINE WHICH MAKES  HIM CRAZY, PENICILLIN WHICH DEVELOPS A RASH AND HE HAS A DYE ALLERGY.   Mr. Mcloud primary cardiologist Dr. Chanda Busing, his electro  physiologist Dr. Sherryl Manges, his primary care Lucca Ballo is Dr. Guerry Bruin.   HISTORY OF PRESENT ILLNESS:  Mr. Yarbro is a 75 year old male, he has a  history of presentation in December of 2007 with a myocardial  infarction. He is status post coronary artery bypass graft surgery in  2000. He underwent stent to the saphenous vein graft to the obtuse  marginal in December. Subsequently he began cardiac rehab. He complained  of shortness of breath and chest pain while riding the stationary bike.  He was found to have on monitor a symptomatic nonsustained ventricular  tachycardia. During a hospitalization in February 2008 the patient  underwent electrophysiology study, this showed that ventricular  tachycardia was inducible. He also had a background bradycardia and  because the EP study was positive he underwent implantation of a  [employer] dual chamber cardioverter defibrillator by Dr. Graciela Husbands,  this was done January 07, 2007. The patient did have a hematoma form  after implantation of the cardioverter defibrillator and this has been  followed in our office, most recently February 21st. At that time there  was a guarded impression that he might need ID however on office visit  today the patient is actually remarkably much better, patient's ICD  pocket is remarkably better, there is no swelling, there is no erythema,  there is no fluctuans. The patient actually has been  feeling overall  better.   However, the patient says that he still has recurrent dizzy spells. Last  Saturday, 5 days ago while at the mall, getting ready to drive home he  had onset somewhat gradual, but of a very pronounced dizzy spell such  that his eyes blurred and he felt that his wife should drive. This spell  lasted 20 minutes and he has been ok since that time. He had seen Dr.  Elsie Lincoln after the implantation in the office and Dr. Elsie Lincoln had taken him  off Lopressor and increased his lisinopril from 5 to 10 mg daily. In the  office today blood pressure measurements showed no evidence of  orthostasis in fact when standing his blood pressure actually increased,  however when he went from lying position to standing he did so rather  rapidly and did feel quite dizzy for about 1 to 2 minute but this then  solidly resolved.   In addition the patient reported that while lying on his left side in  bed both he and his wife were aware that there was a vibratory feeling  for him and his wife could hear a high pitched noise much like  a cell  phone going off. He said this happened 3 times in succession while he  was lying there and this has been a curiosity to him. We interrogated  the device today and there have been no events noted, nor has there been  any indication that the device has made any such request for  interrogation.   The plan will be originally this office visit to set up the patient for  repeat defibrillator threshold study now that the patient has been  thoroughly loaded with amiodarone and this will be done. However,  because the patient is having repeat dizziness I thought that I would  make an appointment for him to see Dr. Elsie Lincoln and this has been made for  March 26 at 2:00. In addition the threshold study will be done Wednesday  March 19th at 7:30 at [facility].   The patient remains on his medications as he has been. They are;  1. Lisinopril 10 mg daily.   2. Plavix 75 mg daily.  3. Aspirin 81 mg daily.  4. Protonix 40 mg daily.  5. Prozac 20 mg daily.  6. Colace 200 mg daily.  7. Restoril 15 mg at bedtime as needed.  8. Amiodarone 200 mg daily.   PHYSICAL EXAMINATION:  Shows alert and oriented male in no acute  distress, somewhat flushed. He says that when he has these dizzy spells  he actually gets a little sweaty and his wife says that he looks pale.  He also relates that he has episodes of these which date back several  years, and not solely since he has had his heart attack in mid December.  He is alert and oriented x3.  LUNGS: Clear to auscultation.  HEART: Regular rate and rhythm, without murmur.  NECK: Supple, no carotid bruits auscultated.  ABDOMEN: Soft, nondistended. There are cicatrices both of coronary  artery bypass graft surgery and from a Billroth I partial gastrectomy.  NEUROLOGICALLY: He has no gross deficits.   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 He also has hypertension and dyslipidemia. There is  apparently abdominal aortic aneurysm which is followed in serial  fashion. Once again the plan is to hold defibrillator threshold study at  [facility] in the EP lab on Wednesday March 19th at 7:30 and for him to  follow up with Dr. Elsie Lincoln March 26th at 2:00. There is just a  possibility that his ventricular tachycardia is running below the  threshold rate of the defibrillator.     when Mr. Woodford appears for his defibrillator threshold study, we will  make a conceted effort to access any dysrhythmia that may have occurred  specifically on Sat, March 1st.     Of note, his wife called later in the afternoon to ask about the odd  noise coming from the defibrillator.  By that time I had inquired of Dr.  Ladona Ridgel about this.  He replied that devices only sound off if there is some particular feature  that needs attention.  In the office a spot interrogation did not reveal any such problem.     Maple Mirza, PA  Electronically Signed    GM/MedQ  DD: 02/04/2007  DT: 02/04/2007  Job #: [id number]   cc:   Madaline Savage, M.D.

## 2011-04-18 NOTE — H&P (Signed)
NAMEANURAG, SCARFO NO.:  [id number]   MEDICAL RECORD NO.:  [id number]          PATIENT TYPE:  INP   LOCATION:  2899                         FACILITY:  [facility]   PHYSICIAN:  Maple Mirza, PA   DATE OF BIRTH:  [birth year]   DATE OF ADMISSION:  02/17/2007  DATE OF DISCHARGE:  02/17/2007                              HISTORY & PHYSICAL   This is a 75 year old male who had a cardioverter defibrillator  implanted for inducible ventricular tachycardia January 07, 2007.  He  has been on amiodarone and it has loaded now since that time.  He now  comes back for defibrillator threshold study now that amiodarone has  loaded.  It is also hoped that his device will be interrogated to check  for tachy arrhythmias, which occurred January 30, 2007 (perhaps).  The  patient fell overwhelmingly dizzy and had to have his wife drive.  This  episode lasted about 20 minutes, also to check incision, which is  draining.  There is a tiny spot at the incision at the lateral aspect  that started 2-3 days ago.  There is a small scab at that point, looks  like he might have a stitch abscess.   VITAL SIGNS:  Temperature 98, blood pressure 132/72, pulse 62 and  regular, respirations 18.  Oxygen saturation 97% on room air.  GENERAL:  He is alert, oriented x3.  LUNGS:  Few rales at the bases, otherwise clear to auscultation.  HEART:  Regular rate and rhythm without murmur.  ABDOMEN:  Soft, nondistended.  Bowel sounds are present.  EXTREMITIES:  No edema.  The incision shows erythema at the lateral half  of the incision.  There is scab at the terminal lateral border of the  incision, possible stitch abscess.   OTHER ASSESSMENTS:  1. History of myocardial infarction December, 2007.      a.     Status post coronary artery bypass graft surgery in 2000       after myocardial infarction.      b.     Stent placed in the saphenous vein graft to the obtuse       marginal December, 2007.  2. Recurrent  dizzy spells since December, 2007; however, he has never      had a spell since January 30, 2007 and that was quite noticeable.  3. Polymyalgia rheumatica.  4. Chronic obstructive pulmonary disease.  5. Gastroesophageal reflux disease.  6. Status post left femoral to popliteal bypass for severe      infrainguinal arterial occlusive disease.  7. Abdominal aortic aneurysm, serial ultrasounds.   PLAN:  Will be for the patient to have defibrillator threshold studies  today, interrogation of device, check the stitch abscess. Home on  antibiotics.  He needs pre-procedure Phenergan and Benadryl.  Is not to  have Zofran as this promotes nausea.   Laboratory studies pertinent to this admission were drawn March 17 at  the office of [facility]; however, they have not been faxed to  [place] and request for them has answered and has ended up on  the answering  machine.      Maple Mirza, PA     GM/MEDQ  D:  02/17/2007  T:  02/17/2007  Job:  [id number]

## 2011-04-18 NOTE — Procedures (Signed)
[place]  Patient:    [name], [name] Visit Number: [id number] MRN: [id number]          Service Type: MED Location: [phone] Attending Physician:  Ophelia Shoulder Dictated by:   Anselmo Rod, M.D. Proc. Date: 10/12/01 Admit Date:  10/10/2001 Discharge Date: 10/12/2001   CC:         Madaline Savage, M.D.                           Procedure Report  DATE OF BIRTH:  [birth year]  PROCEDURE PERFORMED:  Esophagogastroduodenoscopy.  ENDOSCOPIST:  Anselmo Rod, M.D.  INSTRUMENT USED:  Olympus video panendoscope.  INDICATIONS FOR PROCEDURE:  Chest pain and severe reflux and epigastric pain in a 75 year old white male with a history of reflux in the past, and a history of a myocardial infarction/heart disease, rule out peptic ulcer disease.  The patient presents with acute chest pain with essentially negative cardiac workup.  PREPROCEDURE PREPARATION:  Informed consent was obtained from the patient. The patient was fasted for eight hours prior to the procedure.  PREPROCEDURE PHYSICAL EXAMINATION:  VITAL SIGNS:  Stable.  NECK:  Supple.  CHEST:  Clear to auscultation.  ABDOMEN:  Soft, well healed surgical scar from abdominal aortic aneurysm repair.  Epigastric tenderness on palpation.  DESCRIPTION OF PROCEDURE:  The patient was placed in the left lateral decubitus position and sedated with 75 mg of Demerol and 7.5 mg of Versed intravenously.  Once the patient was adequately sedated and maintained on low flow oxygen and continuous cardiac monitoring, the Olympus video panendoscope was advanced over the mouth piece, over the tongue, into the esophagus.  Under direct vision the entire esophagus appeared normal and without lesions.  On advancing the scope into the stomach there was a small gastric polyp seen. The patient is status post Billroth I surgery.  The gastric mucosa seemed slightly hyperemic, consistent with the  Billroth I procedure.  The small bowel appeared normal with no obstruction.  No masses, erosions, or polyps were seen.  There was a small hiatal hernia seen on high retroflexion.  IMPRESSION: 1. Normal appearing esophagus. 2. Small hiatal hernia. 3. Status post Billroth I surgery. 4. Normal proximal small bowel.  RECOMMENDATIONS: 1. Continue proton pump inhibitor for now. 2. Carafate 1 g t.i.d. 3. Outpatient followup in the next 7 to 10 days. Dictated by:   Anselmo Rod, M.D. Attending Physician:  Ophelia Shoulder DD:  10/12/01 TD:  10/13/01 Job: [id number]

## 2011-04-18 NOTE — Assessment & Plan Note (Signed)
[facility]                            CARDIOLOGY OFFICE NOTE   [name], [name]                        MRN:          [id number]  DATE:02/04/2007                            DOB:          06-Jun-1936    ADDENDUM TO OFFICE NOTE:  The patient is having recurrent dizziness even  though he has a cardioverter defibrillator in place. Is it possible that  his VT is below the set point on the device.  Maybe we can check that in  some way when he comes in on March 19.  Another suggestion is that the  patient wear a Holter monitor to see if he is still having some trouble  even though the device is in place.     Maple Mirza, PA  Electronically Signed    GM/MedQ  DD: 02/04/2007  DT: 02/04/2007  Job #: [id number]

## 2011-09-11 LAB — BASIC METABOLIC PANEL
BUN: 8
Chloride: 105
GFR calc non Af Amer: 56 — ABNORMAL LOW
Potassium: 4.2
Sodium: 137

## 2011-09-11 LAB — DIFFERENTIAL
Basophils Absolute: 0
Basophils Relative: 1
Eosinophils Absolute: 0.2
Monocytes Absolute: 0.4
Neutro Abs: 2.9

## 2011-09-11 LAB — CBC
HCT: 33.3 — ABNORMAL LOW
HCT: 38.5 — ABNORMAL LOW
Hemoglobin: 11.2 — ABNORMAL LOW
Hemoglobin: 12.7 — ABNORMAL LOW
MCHC: 33.6
MCV: 82.6
MCV: 83.2
Platelets: 224
RBC: 4.03 — ABNORMAL LOW
RBC: 4.62
RDW: 16.9 — ABNORMAL HIGH
WBC: 4.9

## 2011-09-11 LAB — POCT CARDIAC MARKERS
CKMB, poc: 1.7
Myoglobin, poc: 127
Operator id: 114141
Operator id: 151321
Troponin i, poc: <0.05
Troponin i, poc: <0.05

## 2011-09-11 LAB — COMPREHENSIVE METABOLIC PANEL
ALT: 12
Alkaline Phosphatase: 44
BUN: 12
Chloride: 104
Glucose, Bld: 182 — ABNORMAL HIGH
Potassium: 3.9
Sodium: 136
Total Bilirubin: 0.5

## 2011-09-11 LAB — URINALYSIS, ROUTINE W REFLEX MICROSCOPIC
Bilirubin Urine: NEGATIVE
Glucose, UA: NEGATIVE
Hgb urine dipstick: NEGATIVE
Ketones, ur: NEGATIVE
pH: 7

## 2011-09-11 LAB — PROTIME-INR: INR: 1

## 2011-09-11 LAB — CK TOTAL AND CKMB (NOT AT ARMC): CK, MB: 2.4

## 2011-09-11 LAB — HEMOGLOBIN A1C
Hgb A1c MFr Bld: 6.3 — ABNORMAL HIGH
Mean Plasma Glucose: 147

## 2024-03-01 DEATH — deceased
